# Patient Record
Sex: Female | Born: 1980 | Race: White | Hispanic: No | Marital: Married | Smoking: Never smoker
Health system: Southern US, Community
[De-identification: ages and names within clinical notes are randomized; demographics above are authoritative.]

## PROBLEM LIST (undated history)

## (undated) DIAGNOSIS — E119 Type 2 diabetes mellitus without complications: Secondary | ICD-10-CM

---
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
## 2000-09-20 ENCOUNTER — Other Ambulatory Visit: Admission: RE | Admit: 2000-09-20 | Discharge: 2000-09-20 | Payer: Self-pay | Admitting: *Deleted

## 2001-09-21 ENCOUNTER — Other Ambulatory Visit: Admission: RE | Admit: 2001-09-21 | Discharge: 2001-09-21 | Payer: Self-pay | Admitting: Obstetrics & Gynecology

## 2002-02-21 ENCOUNTER — Encounter: Admission: RE | Admit: 2002-02-21 | Discharge: 2002-05-22 | Payer: Self-pay | Admitting: Internal Medicine

## 2002-10-30 ENCOUNTER — Other Ambulatory Visit: Admission: RE | Admit: 2002-10-30 | Discharge: 2002-10-30 | Payer: Self-pay | Admitting: Obstetrics & Gynecology

## 2003-08-13 ENCOUNTER — Encounter: Admission: RE | Admit: 2003-08-13 | Discharge: 2003-08-13 | Payer: Self-pay | Admitting: Oncology

## 2003-08-20 ENCOUNTER — Ambulatory Visit (HOSPITAL_COMMUNITY): Admission: RE | Admit: 2003-08-20 | Discharge: 2003-08-20 | Payer: Self-pay | Admitting: Oncology

## 2004-08-20 ENCOUNTER — Encounter: Admission: RE | Admit: 2004-08-20 | Discharge: 2004-08-20 | Payer: Self-pay | Admitting: Internal Medicine

## 2005-11-12 ENCOUNTER — Inpatient Hospital Stay (HOSPITAL_COMMUNITY): Admission: AD | Admit: 2005-11-12 | Discharge: 2005-11-12 | Payer: Self-pay | Admitting: Obstetrics and Gynecology

## 2005-11-21 ENCOUNTER — Inpatient Hospital Stay (HOSPITAL_COMMUNITY): Admission: AD | Admit: 2005-11-21 | Discharge: 2005-11-29 | Payer: Self-pay | Admitting: Obstetrics and Gynecology

## 2005-11-22 ENCOUNTER — Encounter (INDEPENDENT_AMBULATORY_CARE_PROVIDER_SITE_OTHER): Payer: Self-pay | Admitting: *Deleted

## 2005-11-25 ENCOUNTER — Encounter (INDEPENDENT_AMBULATORY_CARE_PROVIDER_SITE_OTHER): Payer: Self-pay | Admitting: Cardiology

## 2008-06-25 ENCOUNTER — Ambulatory Visit: Payer: Self-pay | Admitting: Genetic Counselor

## 2008-07-12 ENCOUNTER — Encounter: Admission: RE | Admit: 2008-07-12 | Discharge: 2008-07-12 | Payer: Self-pay | Admitting: Obstetrics and Gynecology

## 2008-08-29 ENCOUNTER — Ambulatory Visit: Payer: Self-pay | Admitting: Genetic Counselor

## 2010-09-05 NOTE — Op Note (Signed)
Cathy Martin, Cathy Martin            ACCOUNT NO.:  [id number]   MEDICAL RECORD NO.:  [id number]          PATIENT TYPE:  INP   LOCATION:  9373                          [facility]ILITY[facility]  [facility]   PHYSICIAN:  Maxie Better, M.D.DATE OF BIRTH:  03-20-1981   DATE OF PROCEDURE:  DATE OF DISCHARGE:                                 OPERATIVE REPORT   PREOPERATIVE DIAGNOSIS:  1.  Arrest of dilatation.  2.  Insulin-dependent diabetes.  3.  Intrauterine gestation at 36+ weeks.  4.  Preeclampsia.   PROCEDURE:  1.  Primary cesarean section.  2.  Sharl Ma hysterotomy.   POSTOPERATIVE DIAGNOSIS:  1.  Arrest of dilatation.  2.  Insulin-dependent diabetes.  3.  Preeclampsia.  4.  Intrauterine gestation at 36+ weeks.  5.  Large-for-gestational-age baby.   ANESTHESIA:  Continuous spinal.   SURGEON:  Maxie Better, M.D.   ASSISTANT:  None.   PROCEDURE IN DETAIL:  Under adequate spinal anesthesia, the patient is  placed in the supine position with a left lateral tilt.  She was sterilely  prepped and draped in usual fashion.  An indwelling Foley catheter was  already in place.  The patient had a large pannus, which was not amenable to  slinging up the pannus superiorly.  Therefore, decision was made to do a  transverse incision above the pannus. 0.25% Marcaine was injected. Skin  incision was then made and carried down to the rectus fascia.  It  was about  3 to 4 inches deep.  When the fascia was reached it was opened transversely.  Fascia was then bluntly and sharply dissected off the rectus muscles in an  inferior fashion.  The rectus muscle was split in the midline. The  parietoperitoneum was opened bluntly and extended.  The vesicouterine  peritoneum was opened transversely.  The bladder was bluntly dissected off  the lower uterine segment and displaced inferiorly.  A curvilinear low  transverse uterine incision was then made and extended bilaterally using  bandage scissors.  Subsequent  delivery of a live female from the left occiput  posterior position was accomplished.  Baby had the cord looped against the  chest.  Baby was bulb suctioned in the abdomen.  Cord was clamped and cut.  The baby was transferred to the waiting pediatricians. who assigned Apgars  of 9 and 9 at one and five minutes.  Cord pH was obtained, which ultimately  was 7.10.  Cord bloods were obtained.  Cord blood private collection was  obtained for the patient.  The placenta was spontaneous, intact, and sent to  pathology.   Uterine cavity was then cleaned of debris.  Uterine incision had no  extension.  Uterine incision was closed in two layers, the first layer 0-  Monocryl running lock stitch, second layer was imbricated using 0-Monocryl  suture.  Right angle figure-of-eight suture was then placed, with good  hemostasis, for bleeding sites.  Normal tubes and ovaries were noted  bilaterally.   The abdomen was then copiously irrigated, suctioned.  Small bleeders were  cauterized.  Adequate hemostasis was noted.  The parietoperitoneum was now  closed, with  the rectus fascia was closed with 0-icryl x2.  The subcutaneous  layer was closed in two layers using 2-0 plain sutures and skin approximated  using Ethicon staples.   The weight of the baby was 7 pounds 15 ounces.  Sponge and instrument count  x2 was correct.  Intraoperative fluid was 1400 mL crystalloid.  Urine output  was 150 mL concentrated urine.  The estimated blood loss was 700 mL.   The patient tolerated procedure well, was transferred to recovery in stable  condition.      Maxie Better, M.D.  Electronically Signed     [place]/MEDQ  D:  11/22/2005  T:  11/23/2005  Job:  [id number]

## 2010-09-05 NOTE — Discharge Summary (Signed)
Cathy Martin, Cathy Martin            ACCOUNT NO.:  [id number]   MEDICAL RECORD NO.:  [id number]          PATIENT TYPE:  INP   LOCATION:  9371                          [facility]  [facility]   PHYSICIAN:  Maxie Better, M.D.DATE OF BIRTH:  1980-06-25   DATE OF ADMISSION:  11/21/2005  DATE OF DISCHARGE:  11/29/2005                                 DISCHARGE SUMMARY   ADMISSION DIAGNOSIS:  Active labor, insulin dependent diabetes mellitus, pre-  eclampsia, group B Strep positive, intrauterine gestation at 59 and 2/7  weeks.   DISCHARGE DIAGNOSIS:  Intrauterine gestation at 2 and 2/7 weeks delivered,  insulin dependent diabetes mellitus, hypertension, congestive heart failure,  possible diastolic dysfunction, arrest of dilatation.   PROCEDURE:  Primary cesarean section.   HISTORY OF PRESENT ILLNESS:  30 year old G1, P0, female at 29 and 2/7 weeks  with insulin dependent diabetes who was admitted with active labor.  The  patient was noted to be group B Strep culture positive.   HOSPITAL COURSE:  The patient was admitted to [facility].  Her urine  total protein collection had been 1264 mg for 24 hours.  She was classified  as a pre-eclamptic.  Her blood pressure on presentation was 193/91.  Magnesium sulfate was begun, penicillin prophylaxis was started, IV insulin  drip was begun, and epidural for pain management was initiated.  The patient  was monitored using the Glucomander for her blood sugar management.  The  patient spontaneously ruptured her membranes.  At that time, she was 4, -2,  right occiput anterior presentation, she had an intrauterine pressure  catheter placed, contractions were every 2-3 minutes, each contraction with  some variables.  Pitocin augmentation was done.  The patient began having  late decelerations.  The patient also had recalcitrant blood sugar levels in  the 250 range.  She progressed to 5 cm and no change was noted.  Recommendation made for a primary  cesarean section.  The patient had a  primary C-section resulting in delivery of a 7 pounds 15 ounce live female,  Apgars 9 and 9, cord pH 7.10.  Postoperatively, the patient was continued on  magnesium sulfate, insulin management per her endocrinologist.  She was in  the intensive care unit due to the magnesium sulfate and insulin drip.  Magnesium was continued.  Her insulin was adjusted.  On November 25, 2005, the  patient began having problems with breathing.  She had audible wheezing at  that time.  Her O2 saturation on room air was 84%.  She was given 20 mg  Lasix IV, Labetalol 20 mg IV was also given.  Blood pressure was 162/85.  Oxygen supplementation was done.  The patient was transferred back to the  AICU.  She had been started on Labetalol 100 mg b.i.d. for her blood  pressure.  Cardiology consultation was obtained for the chest x-ray that had  been performed as part of her evaluation that had revealed CHF with  bilateral pleural effusions and atelectasis.  Dr. Algie Coffer saw the patient.  He the chest x-ray showed mild worsening of pulmonary edema, small pleural  effusions.  The  patient was weighed.  Electrolytes were done.  Cardiac  markers were obtained and were noted to have the BNP slightly increased.  Repeat chest x-ray was done.  The patient began diuresing very well and her  pulmonary edema resolved.  She had an echo.  EKG showed normal sinus rhythm.  It was felt that the CHF was resolving.  Postop day six, the patient was  doing better, felt better.  Her O2 saturation on room air was 95-96%.  Her  blood pressure was 180/80 to 90.  She was felt to be stable to be  transferred to the postpartum floor.  She was continued on oral diuretics  and was felt well enough to be discharged.  On August 12, the patient had  good pain control, her blood pressure was in the 150s over 80s.  Her  incision was without evidence of infection.  She was discharged home.   DISPOSITION:  Home.    CONDITION ON DISCHARGE:  Stable.   DISCHARGE MEDICATIONS:  Lasix 40 mg one tablet twice a day for three days  then once daily, potassium chloride 20 mEq once twice a day for three days  then once a day, Altace 5 mg one p.o. daily, Labetalol 200 mg one p.o.  b.i.d., Avelox 400 mg one daily for four days, prenatal vitamins, Darvocet 1-  2 tablets every 4-6 hours as needed.   FOLLOW UP:  [facility] in four weeks, Dr. Algie Coffer in one week.   DISCHARGE INSTRUCTIONS:  The postpartum book given and review of PIH warning  signs.      Maxie Better, M.D.  Electronically Signed     [place]/MEDQ  D:  01/14/2006  T:  01/15/2006  Job:  [id number]

## 2010-09-05 NOTE — Consult Note (Signed)
Cathy Martin, Cathy Martin            ACCOUNT NO.:  [id number]   MEDICAL RECORD NO.:  [id number]          PATIENT TYPE:  MAT   LOCATION:  MATC                          [facility]  [facility]   PHYSICIAN:  Richardean Sale, M.D.   DATE OF BIRTH:  [birth year]   DATE OF CONSULTATION:  11/12/2005  DATE OF DISCHARGE:                                   CONSULTATION   CHIEF COMPLAINT:  Elevated blood pressure in the office.   HISTORY OF PRESENT ILLNESS:  This is a 30 year old white female gravida 1,  para 0 who is at [redacted] weeks gestation who went to the office today for a  routine visit, was found to have a blood pressure of 160/100.  No  proteinuria, and some increased swelling in the lower extremities.  The  patient was sent to the hospital for evaluation for possible preeclampsia.  Pregnancy has been complicated by insulin-dependent diabetes, preexisting,  and the patient is currently on an insulin pump.  Her insulin is managed by  Dr. Jacky Kindle.  She has had no trouble with hypertension in the past.  She  reports a mild headache that has been present intermittently throughout her  pregnancy.  She denies any visual changes or epigastric pain. She reports  good movement.  Denies any loss of fluids, vaginal bleeding or contractions.  Cervical exam in the office was closed.   PAST OBSTETRICS HISTORY:  Gravida 1, para 0.   PAST GYNECOLOGICAL HISTORY:  Remote history of an abnormal Pap smear.  No  LEEP or cone biopsy.  No history of HSV.   PAST MEDICAL HISTORY:  Significant for insulin-dependent diabetes, currently  on an insulin pump.   FAMILY HISTORY:  Positive for hypertension, diabetes, prostate cancer.  No  mental retardation, Down's syndrome, chromosome abnormalities, open neural  tube defects, cystic fibrosis.   SOCIAL HISTORY:  She is married.  Denies alcohol or drugs.  Works as a  Acupuncturist.  The patient was a former smoker and quite when she  found out she was pregnant.   MEDICATIONS:  1.  Humalog insulin pump.  2.  Prenatal vitamins.  3.  Benadryl p.r.n.   ALLERGIES:  MUSHROOMS.   PHYSICAL EXAMINATION:  VITAL SIGNS:  Afebrile.  Blood pressure range from  130-140/60-70.  GENERAL:  She is a well-developed, well-nourished white female who is in no  acute distress.  There is no periorbital edema.  HEART:  Regular rate and rhythm.  LUNGS:  Clear to auscultation bilaterally.  ABDOMEN:  Gravid, soft, obese.  Nontender.  Appears AGA.  EXTREMITIES:  Pitting edema 2+ to the mid shin bilaterally.  Legs are  nontender.  Deep tendon reflexes are 2+ bilaterally with no clonus.  NEUROLOGICAL:  Nonfocal.   STUDIES:  Fetal heart rate tracing is reactive.  Tocometry tracing shows  occasional contraction.  Ultrasound shows C. difficile at 3000 g which is  the 90-95 percentile for 35 weeks.  Amniotic fluid is normal at 11.3 cm and  position is vertex.   LABORATORY DATA:  Urine is negative for protein but is positive for white  blood cell count and  large amount of bacteria.  Hemoglobin 13.3, platelets  249,000.  Uric acid is 4.5.  LDH 143, AST 20, ALT 19, creatinine 0.7.   ASSESSMENT:  76.  A 30 year old gravida 1, para 0, white female who is at 35+ weeks      gestation with insulin-dependent diabetes now with elevated blood      pressures.  No evidence of proteinuria.  No symptoms or preeclampsia and      no laboratory studies suggesting of HELP syndrome.  2.  UTI.   PLAN:  1.  Will start Keflex 500 mg p.o. q.i.d. for the patient's UTI and will send      urine for culture.  2.  Will send patient home on modified bed rest with preeclampsia      precautions.  She is to perform a 24-hour urine collection and bring it      back to the hospital on Saturday morning.  3.  The patient is to return to the office on Monday for her scheduled OB      visit and an NFT.  She is to call for any headache, visual changes,      epigastric pain (headache that is any worse than her  typical daily      headache and not ridden by Tylenol).  4.  The patient is to call for any contractions, loss of fluid, vaginal      bleeding or decrease in fetal movement.  5.  The patient voices understanding of the above plan and agrees to      proceed.      Richardean Sale, M.D.  Electronically Signed     [place]/MEDQ  D:  11/12/2005  T:  11/12/2005  Job:  [id number]

## 2011-12-02 LAB — OB RESULTS CONSOLE GC/CHLAMYDIA
Chlamydia: NEGATIVE
Gonorrhea: NEGATIVE

## 2011-12-02 LAB — OB RESULTS CONSOLE RPR: RPR: NONREACTIVE

## 2012-02-01 LAB — OB RESULTS CONSOLE ABO/RH: RH Type: NEGATIVE

## 2012-04-20 NOTE — L&D Delivery Note (Signed)
Cesarean Section Procedure Note  Indications: previous uterine incision Kerr x 1, PROM, PreTerm Labor  Pre-operative Diagnosis: 35 week 4 day pregnancy.  Post-operative Diagnosis: same  Surgeon: Genia Del   Assistants: None  Anesthesia: Spinal anesthesia   Procedure Details   The patient was seen in the Holding Room. The risks, benefits, complications, treatment options, and expected outcomes were discussed with the patient.  The patient concurred with the proposed plan, giving informed consent.  The site of surgery properly noted/marked. The patient was taken to Operating Room # 9, identified as Cathy Martin and the procedure verified as C-Section Delivery. A Time Out was held and the above information confirmed.  After induction of anesthesia, the patient was draped and prepped in the usual sterile manner. A Pfannenstiel incision was made and carried down through the subcutaneous tissue to the fascia. Fascial incision was made and extended transversely. The fascia was separated from the underlying rectus tissue superiorly and inferiorly. The peritoneum was identified and entered.  Adhesions with the omentum were lysed.  Peritoneal incision was extended longitudinally. The utero-vesical peritoneal reflection was incised transversely and the bladder flap was bluntly freed from the lower uterine segment.  The Alexis retractor was inserted easily.  A low transverse uterine incision was made. Delivered from compound left arm, cephalic occiput post with neck in extension, low in pelvis presentation was a Female with Apgar scores pending from NICU. After the umbilical cord was clamped and cut cord blood was obtained for evaluation. The placenta was removed intact and appeared normal. The uterine outline, tubes and ovaries appeared normal. The uterine incision was closed with running locked sutures of Vicryl-0.  A deep left inferior extension was closed with a locked running suture of  Vicryl-0.  A second layer, a Lembert imbricated suture of Vicryl -0 was added. Hemostasis was observed. Lavage was carried out until clear. The fascia was then reapproximated with running sutures of Vicryl.  The adipose tissue was approximated with a running suture of Plain. The skin was reapproximated with Staples.  A compressive dressing was applied.  Instrument, sponge, and needle counts were correct prior the abdominal closure and at the conclusion of the case.   Estimated Blood Loss:  800 cc          Specimens: Placenta         Complications:  None; patient tolerated the procedure well.         Disposition: PACU - hemodynamically stable.         Condition: stable  Attending Attestation: I was present and scrubbed for the entire procedure.  Genia Del MD  06/03/2012 at 6:06 am

## 2012-06-03 ENCOUNTER — Encounter (HOSPITAL_COMMUNITY): Payer: Self-pay | Admitting: Anesthesiology

## 2012-06-03 ENCOUNTER — Encounter (HOSPITAL_COMMUNITY): Payer: Self-pay | Admitting: *Deleted

## 2012-06-03 ENCOUNTER — Encounter (HOSPITAL_COMMUNITY)
Admission: AD | Disposition: A | Discharge status: Home / Self Care | Payer: Self-pay | Source: Ambulatory Visit | Attending: Obstetrics and Gynecology

## 2012-06-03 ENCOUNTER — Inpatient Hospital Stay (HOSPITAL_COMMUNITY): Payer: Managed Care, Other (non HMO) | Admitting: Anesthesiology

## 2012-06-03 ENCOUNTER — Inpatient Hospital Stay (HOSPITAL_COMMUNITY)
Admission: AD | Admit: 2012-06-03 | Discharge: 2012-06-06 | DRG: 765 | Disposition: A | Discharge status: Home / Self Care | Payer: Managed Care, Other (non HMO) | Source: Ambulatory Visit | Attending: Obstetrics and Gynecology | Admitting: Obstetrics and Gynecology

## 2012-06-03 DIAGNOSIS — D649 Anemia, unspecified: Secondary | ICD-10-CM | POA: Diagnosis not present

## 2012-06-03 DIAGNOSIS — O34219 Maternal care for unspecified type scar from previous cesarean delivery: Principal | ICD-10-CM | POA: Diagnosis present

## 2012-06-03 DIAGNOSIS — O9903 Anemia complicating the puerperium: Secondary | ICD-10-CM | POA: Diagnosis not present

## 2012-06-03 DIAGNOSIS — E119 Type 2 diabetes mellitus without complications: Secondary | ICD-10-CM | POA: Diagnosis present

## 2012-06-03 DIAGNOSIS — O4290 Premature rupture of membranes, unspecified as to length of time between rupture and onset of labor, unspecified weeks of gestation: Secondary | ICD-10-CM | POA: Diagnosis present

## 2012-06-03 DIAGNOSIS — E109 Type 1 diabetes mellitus without complications: Secondary | ICD-10-CM | POA: Diagnosis present

## 2012-06-03 DIAGNOSIS — O2432 Unspecified pre-existing diabetes mellitus in childbirth: Secondary | ICD-10-CM | POA: Diagnosis present

## 2012-06-03 HISTORY — DX: Type 2 diabetes mellitus without complications: E11.9

## 2012-06-03 LAB — COMPREHENSIVE METABOLIC PANEL
ALT: 7 U/L (ref 0–35)
AST: 10 U/L (ref 0–37)
Albumin: 2.5 g/dL — ABNORMAL LOW (ref 3.5–5.2)
CO2: 15 mEq/L — ABNORMAL LOW (ref 19–32)
Chloride: 100 mEq/L (ref 96–112)
Creatinine, Ser: 0.62 mg/dL (ref 0.50–1.10)
GFR calc non Af Amer: >90 mL/min (ref >90)
Potassium: 4.1 mEq/L (ref 3.5–5.1)
Sodium: 132 mEq/L — ABNORMAL LOW (ref 135–145)
Total Bilirubin: 0.3 mg/dL (ref 0.3–1.2)

## 2012-06-03 LAB — CBC
HCT: 39 % (ref 36.0–46.0)
Hemoglobin: 13.2 g/dL (ref 12.0–15.0)
MCH: 29.5 pg (ref 26.0–34.0)
MCHC: 33.8 g/dL (ref 30.0–36.0)
MCV: 87.2 fL (ref 78.0–100.0)

## 2012-06-03 LAB — GLUCOSE, CAPILLARY

## 2012-06-03 LAB — URIC ACID: Uric Acid, Serum: 4.1 mg/dL (ref 2.4–7.0)

## 2012-06-03 SURGERY — Surgical Case
Anesthesia: Spinal | Site: Abdomen | Wound class: Clean Contaminated

## 2012-06-03 MED ORDER — MORPHINE SULFATE (PF) 0.5 MG/ML IJ SOLN
INTRAMUSCULAR | Status: DC | PRN
  Administered 2012-06-03: .2 mg via INTRATHECAL

## 2012-06-03 MED ORDER — MEPERIDINE HCL 25 MG/ML IJ SOLN: 6.2500 mg | INTRAMUSCULAR | Status: DC | PRN

## 2012-06-03 MED ORDER — SCOPOLAMINE 1 MG/3DAYS TD PT72
MEDICATED_PATCH | TRANSDERMAL | Status: AC
  Filled 2012-06-03: qty 1

## 2012-06-03 MED ORDER — SIMETHICONE 80 MG PO CHEW: 80.0000 mg | CHEWABLE_TABLET | ORAL | Status: DC | PRN

## 2012-06-03 MED ORDER — OXYCODONE-ACETAMINOPHEN 5-325 MG PO TABS
1.0000 | ORAL_TABLET | ORAL | Status: DC | PRN
  Administered 2012-06-03: 1 via ORAL
  Administered 2012-06-03: 2 via ORAL
  Administered 2012-06-04: 1 via ORAL
  Administered 2012-06-04: 2 via ORAL
  Administered 2012-06-04: 1 via ORAL
  Administered 2012-06-04: 2 via ORAL
  Administered 2012-06-04 – 2012-06-05 (×3): 1 via ORAL
  Administered 2012-06-05: 2 via ORAL
  Administered 2012-06-06: 1 via ORAL
  Filled 2012-06-03: qty 1
  Filled 2012-06-03: qty 2
  Filled 2012-06-03: qty 1
  Filled 2012-06-03: qty 2
  Filled 2012-06-03: qty 1
  Filled 2012-06-03: qty 2
  Filled 2012-06-03 (×3): qty 1
  Filled 2012-06-03: qty 2
  Filled 2012-06-03: qty 1

## 2012-06-03 MED ORDER — DIPHENHYDRAMINE HCL 50 MG/ML IJ SOLN: 25.0000 mg | INTRAMUSCULAR | Status: DC | PRN

## 2012-06-03 MED ORDER — LACTATED RINGERS IV SOLN
INTRAVENOUS | Status: DC
  Administered 2012-06-03: 05:00:00 via INTRAVENOUS

## 2012-06-03 MED ORDER — SENNOSIDES-DOCUSATE SODIUM 8.6-50 MG PO TABS
2.0000 | ORAL_TABLET | Freq: Every day | ORAL | Status: DC
  Administered 2012-06-03 – 2012-06-05 (×3): 2 via ORAL

## 2012-06-03 MED ORDER — MAGNESIUM HYDROXIDE 400 MG/5ML PO SUSP: 30.0000 mL | ORAL | Status: DC | PRN

## 2012-06-03 MED ORDER — KETOROLAC TROMETHAMINE 60 MG/2ML IM SOLN
INTRAMUSCULAR | Status: AC
  Filled 2012-06-03: qty 2

## 2012-06-03 MED ORDER — SCOPOLAMINE 1 MG/3DAYS TD PT72
1.0000 | MEDICATED_PATCH | Freq: Once | TRANSDERMAL | Status: AC
  Administered 2012-06-03: 1.5 mg via TRANSDERMAL

## 2012-06-03 MED ORDER — NALBUPHINE HCL 10 MG/ML IJ SOLN
5.0000 mg | INTRAMUSCULAR | Status: DC | PRN
  Filled 2012-06-03: qty 1

## 2012-06-03 MED ORDER — ONDANSETRON HCL 4 MG/2ML IJ SOLN: 4.0000 mg | Freq: Three times a day (TID) | INTRAMUSCULAR | Status: DC | PRN

## 2012-06-03 MED ORDER — LANOLIN HYDROUS EX OINT: 1.0000 "application " | TOPICAL_OINTMENT | CUTANEOUS | Status: DC | PRN

## 2012-06-03 MED ORDER — INSULIN PUMP
Freq: Three times a day (TID) | SUBCUTANEOUS | Status: DC
  Administered 2012-06-03: 17:00:00 via SUBCUTANEOUS
  Administered 2012-06-04: 4.55 via SUBCUTANEOUS
  Administered 2012-06-04: 14.5 via SUBCUTANEOUS
  Administered 2012-06-05: 9.85 via SUBCUTANEOUS
  Administered 2012-06-05: 10.1 via SUBCUTANEOUS
  Administered 2012-06-06: 13:00:00 via SUBCUTANEOUS
  Administered 2012-06-06: 1.8 via SUBCUTANEOUS
  Filled 2012-06-03: qty 1

## 2012-06-03 MED ORDER — INSULIN PUMP
Freq: Three times a day (TID) | SUBCUTANEOUS | Status: DC
  Filled 2012-06-03: qty 1

## 2012-06-03 MED ORDER — ONDANSETRON HCL 4 MG PO TABS: 4.0000 mg | ORAL_TABLET | ORAL | Status: DC | PRN

## 2012-06-03 MED ORDER — BUTORPHANOL TARTRATE 1 MG/ML IJ SOLN
1.0000 mg | Freq: Once | INTRAMUSCULAR | Status: AC
  Administered 2012-06-03: 1 mg via INTRAVENOUS
  Filled 2012-06-03: qty 1

## 2012-06-03 MED ORDER — KETOROLAC TROMETHAMINE 30 MG/ML IJ SOLN
30.0000 mg | Freq: Four times a day (QID) | INTRAMUSCULAR | Status: AC | PRN
  Filled 2012-06-03: qty 1

## 2012-06-03 MED ORDER — WITCH HAZEL-GLYCERIN EX PADS: 1.0000 "application " | MEDICATED_PAD | CUTANEOUS | Status: DC | PRN

## 2012-06-03 MED ORDER — BUPIVACAINE IN DEXTROSE 0.75-8.25 % IT SOLN
INTRATHECAL | Status: DC | PRN
  Administered 2012-06-03: 12 mg via INTRATHECAL

## 2012-06-03 MED ORDER — KETOROLAC TROMETHAMINE 60 MG/2ML IM SOLN
60.0000 mg | Freq: Once | INTRAMUSCULAR | Status: AC | PRN
  Administered 2012-06-03: 60 mg via INTRAMUSCULAR

## 2012-06-03 MED ORDER — LACTATED RINGERS IV SOLN
INTRAVENOUS | Status: DC | PRN
  Administered 2012-06-03: 04:00:00 via INTRAVENOUS

## 2012-06-03 MED ORDER — DIPHENHYDRAMINE HCL 25 MG PO CAPS
25.0000 mg | ORAL_CAPSULE | ORAL | Status: DC | PRN
  Filled 2012-06-03: qty 1

## 2012-06-03 MED ORDER — HYDROMORPHONE HCL PF 1 MG/ML IJ SOLN
INTRAMUSCULAR | Status: AC
  Filled 2012-06-03: qty 1

## 2012-06-03 MED ORDER — SODIUM CHLORIDE 0.9 % IJ SOLN
3.0000 mL | INTRAMUSCULAR | Status: DC | PRN
  Administered 2012-06-03: 3 mL via INTRAVENOUS

## 2012-06-03 MED ORDER — KETOROLAC TROMETHAMINE 30 MG/ML IJ SOLN
30.0000 mg | Freq: Four times a day (QID) | INTRAMUSCULAR | Status: AC | PRN
  Administered 2012-06-03: 30 mg via INTRAVENOUS

## 2012-06-03 MED ORDER — LACTATED RINGERS IV SOLN
INTRAVENOUS | Status: DC
  Administered 2012-06-03: 08:00:00 via INTRAVENOUS

## 2012-06-03 MED ORDER — FAMOTIDINE IN NACL 20-0.9 MG/50ML-% IV SOLN
20.0000 mg | Freq: Once | INTRAVENOUS | Status: AC
  Administered 2012-06-03: 20 mg via INTRAVENOUS
  Filled 2012-06-03: qty 50

## 2012-06-03 MED ORDER — FERROUS SULFATE 325 (65 FE) MG PO TABS
325.0000 mg | ORAL_TABLET | Freq: Two times a day (BID) | ORAL | Status: DC
Start: 2012-06-03 — End: 2012-06-04
  Administered 2012-06-03 – 2012-06-04 (×2): 325 mg via ORAL
  Filled 2012-06-03 (×2): qty 1

## 2012-06-03 MED ORDER — NALOXONE HCL 0.4 MG/ML IJ SOLN: 0.4000 mg | INTRAMUSCULAR | Status: DC | PRN

## 2012-06-03 MED ORDER — TETANUS-DIPHTH-ACELL PERTUSSIS 5-2.5-18.5 LF-MCG/0.5 IM SUSP
0.5000 mL | Freq: Once | INTRAMUSCULAR | Status: DC
  Filled 2012-06-03: qty 0.5

## 2012-06-03 MED ORDER — HYDROMORPHONE HCL PF 1 MG/ML IJ SOLN: 0.2500 mg | INTRAMUSCULAR | Status: DC | PRN

## 2012-06-03 MED ORDER — IBUPROFEN 600 MG PO TABS
600.0000 mg | ORAL_TABLET | Freq: Four times a day (QID) | ORAL | Status: DC
  Administered 2012-06-03 – 2012-06-06 (×11): 600 mg via ORAL
  Filled 2012-06-03 (×11): qty 1

## 2012-06-03 MED ORDER — CITRIC ACID-SODIUM CITRATE 334-500 MG/5ML PO SOLN
30.0000 mL | Freq: Once | ORAL | Status: AC
  Administered 2012-06-03: 30 mL via ORAL
  Filled 2012-06-03: qty 15

## 2012-06-03 MED ORDER — BUPIVACAINE HCL (PF) 0.25 % IJ SOLN
INTRAMUSCULAR | Status: DC | PRN
  Administered 2012-06-03: 10 mL

## 2012-06-03 MED ORDER — ONDANSETRON HCL 4 MG/2ML IJ SOLN
INTRAMUSCULAR | Status: DC | PRN
  Administered 2012-06-03: 4 mg via INTRAVENOUS

## 2012-06-03 MED ORDER — OXYTOCIN 10 UNIT/ML IJ SOLN
40.0000 [IU] | INTRAVENOUS | Status: DC | PRN
  Administered 2012-06-03: 40 [IU] via INTRAVENOUS

## 2012-06-03 MED ORDER — CEFAZOLIN SODIUM-DEXTROSE 2-3 GM-% IV SOLR
2.0000 g | Freq: Once | INTRAVENOUS | Status: AC
  Administered 2012-06-03: 2 g via INTRAVENOUS
  Filled 2012-06-03: qty 50

## 2012-06-03 MED ORDER — DIPHENHYDRAMINE HCL 25 MG PO CAPS: 25.0000 mg | ORAL_CAPSULE | Freq: Four times a day (QID) | ORAL | Status: DC | PRN

## 2012-06-03 MED ORDER — PROMETHAZINE HCL 25 MG/ML IJ SOLN: 6.2500 mg | INTRAMUSCULAR | Status: DC | PRN

## 2012-06-03 MED ORDER — FENTANYL CITRATE 0.05 MG/ML IJ SOLN
INTRAMUSCULAR | Status: DC | PRN
  Administered 2012-06-03: 12.5 ug via INTRATHECAL

## 2012-06-03 MED ORDER — LABETALOL HCL 200 MG PO TABS
200.0000 mg | ORAL_TABLET | Freq: Two times a day (BID) | ORAL | Status: DC
  Filled 2012-06-03 (×5): qty 1

## 2012-06-03 MED ORDER — ONDANSETRON HCL 4 MG/2ML IJ SOLN: 4.0000 mg | INTRAMUSCULAR | Status: DC | PRN

## 2012-06-03 MED ORDER — PRENATAL MULTIVITAMIN CH
1.0000 | ORAL_TABLET | Freq: Every day | ORAL | Status: DC
  Administered 2012-06-04 – 2012-06-06 (×3): 1 via ORAL
  Filled 2012-06-03 (×3): qty 1

## 2012-06-03 MED ORDER — ZOLPIDEM TARTRATE 5 MG PO TABS: 5.0000 mg | ORAL_TABLET | Freq: Every evening | ORAL | Status: DC | PRN

## 2012-06-03 MED ORDER — NALOXONE HCL 1 MG/ML IJ SOLN
1.0000 ug/kg/h | INTRAVENOUS | Status: DC | PRN
  Filled 2012-06-03: qty 2

## 2012-06-03 MED ORDER — OXYTOCIN 40 UNITS IN LACTATED RINGERS INFUSION - SIMPLE MED: 62.5000 mL/h | INTRAVENOUS | Status: AC

## 2012-06-03 MED ORDER — DIPHENHYDRAMINE HCL 50 MG/ML IJ SOLN: 12.5000 mg | INTRAMUSCULAR | Status: DC | PRN

## 2012-06-03 MED ORDER — METOCLOPRAMIDE HCL 5 MG/ML IJ SOLN: 10.0000 mg | Freq: Three times a day (TID) | INTRAMUSCULAR | Status: DC | PRN

## 2012-06-03 MED ORDER — MENTHOL 3 MG MT LOZG: 1.0000 | LOZENGE | OROMUCOSAL | Status: DC | PRN

## 2012-06-03 MED ORDER — KETOROLAC TROMETHAMINE 30 MG/ML IJ SOLN: 15.0000 mg | Freq: Once | INTRAMUSCULAR | Status: DC | PRN

## 2012-06-03 MED ORDER — DIBUCAINE 1 % RE OINT: 1.0000 "application " | TOPICAL_OINTMENT | RECTAL | Status: DC | PRN

## 2012-06-03 MED ORDER — SIMETHICONE 80 MG PO CHEW
80.0000 mg | CHEWABLE_TABLET | Freq: Three times a day (TID) | ORAL | Status: DC
  Administered 2012-06-03 – 2012-06-06 (×11): 80 mg via ORAL

## 2012-06-03 MED ORDER — MEPERIDINE HCL 25 MG/ML IJ SOLN
INTRAMUSCULAR | Status: DC | PRN
  Administered 2012-06-03 (×2): 12.5 mg via INTRAVENOUS

## 2012-06-03 MED FILL — Insulin Aspart Inj 100 Unit/ML: SUBCUTANEOUS | Qty: 10 | Status: AC

## 2012-06-03 SURGICAL SUPPLY — 37 items
CLOTH BEACON ORANGE TIMEOUT ST (SAFETY) ×2 IMPLANT
CONTAINER PREFILL 10% NBF 15ML (MISCELLANEOUS) IMPLANT
DRAPE LG THREE QUARTER DISP (DRAPES) ×2 IMPLANT
DRSG OPSITE POSTOP 4X10 (GAUZE/BANDAGES/DRESSINGS) ×2 IMPLANT
DURAPREP 26ML APPLICATOR (WOUND CARE) ×2 IMPLANT
ELECT REM PT RETURN 9FT ADLT (ELECTROSURGICAL) ×2
ELECTRODE REM PT RTRN 9FT ADLT (ELECTROSURGICAL) ×1 IMPLANT
EXTRACTOR VACUUM M CUP 4 TUBE (SUCTIONS) IMPLANT
GLOVE BIO SURGEON STRL SZ 6.5 (GLOVE) ×4 IMPLANT
GLOVE BIOGEL PI IND STRL 7.0 (GLOVE) ×1 IMPLANT
GLOVE BIOGEL PI INDICATOR 7.0 (GLOVE) ×1
GOWN PREVENTION PLUS LG XLONG (DISPOSABLE) ×6 IMPLANT
HEMOSTAT SURGICEL 2X14 (HEMOSTASIS) ×1 IMPLANT
KIT ABG SYR 3ML LUER SLIP (SYRINGE) IMPLANT
NDL HYPO 25X5/8 SAFETYGLIDE (NEEDLE) ×1 IMPLANT
NEEDLE HYPO 22GX1.5 SAFETY (NEEDLE) ×2 IMPLANT
NEEDLE HYPO 25X5/8 SAFETYGLIDE (NEEDLE) ×2 IMPLANT
PACK C SECTION WH (CUSTOM PROCEDURE TRAY) ×2 IMPLANT
PAD ABD 7.5X8 STRL (GAUZE/BANDAGES/DRESSINGS) ×1 IMPLANT
PAD OB MATERNITY 4.3X12.25 (PERSONAL CARE ITEMS) ×2 IMPLANT
RTRCTR C-SECT PINK 25CM LRG (MISCELLANEOUS) ×1 IMPLANT
SLEEVE SCD COMPRESS KNEE MED (MISCELLANEOUS) ×1 IMPLANT
STAPLER VISISTAT 35W (STAPLE) ×1 IMPLANT
SUT PLAIN 0 NONE (SUTURE) IMPLANT
SUT PLAIN 2 0 XLH (SUTURE) ×1 IMPLANT
SUT VIC AB 0 CT1 27 (SUTURE) ×4
SUT VIC AB 0 CT1 27XBRD ANBCTR (SUTURE) ×2 IMPLANT
SUT VIC AB 0 CTX 36 (SUTURE) ×8
SUT VIC AB 0 CTX36XBRD ANBCTRL (SUTURE) ×2 IMPLANT
SUT VIC AB 2-0 CT1 27 (SUTURE) ×2
SUT VIC AB 2-0 CT1 TAPERPNT 27 (SUTURE) ×1 IMPLANT
SUT VIC AB 3-0 SH 27 (SUTURE)
SUT VIC AB 3-0 SH 27X BRD (SUTURE) IMPLANT
SYR CONTROL 10ML LL (SYRINGE) ×2 IMPLANT
TOWEL OR 17X24 6PK STRL BLUE (TOWEL DISPOSABLE) ×6 IMPLANT
TRAY FOLEY CATH 14FR (SET/KITS/TRAYS/PACK) ×2 IMPLANT
WATER STERILE IRR 1000ML POUR (IV SOLUTION) ×2 IMPLANT

## 2012-06-03 NOTE — Anesthesia Procedure Notes (Signed)
Spinal  Patient location during procedure: OR Start time: 06/03/2012 4:12 AM End time: 06/03/2012 4:20 AM Staffing Anesthesiologist: Sandrea Hughs Performed by: anesthesiologist  Preanesthetic Checklist Completed: patient identified, site marked, surgical consent, pre-op evaluation, timeout performed, IV checked, risks and benefits discussed and monitors and equipment checked Spinal Block Patient position: sitting Prep: DuraPrep Patient monitoring: heart rate, cardiac monitor, continuous pulse ox and blood pressure Approach: midline Location: L3-4 Injection technique: single-shot Needle Needle type: Sprotte  Needle gauge: 24 G Needle length: 12.7 cm Needle insertion depth: 10 cm Assessment Sensory level: T6 Additional Notes Needed 17G Tuohy x 3 to 9cm followed by Sprotte to + CSF.

## 2012-06-03 NOTE — Progress Notes (Signed)
06-03-12 Have spoken with patient by phone to make sure patient is comfortable using her pump.  She states she is checking her own glucose, "keeping up with it myself" is what she stated.  Explained to her that the nursing staff would be checking the glucose values themselves for documentation purposes and to give the basis for any changes needed in basal rates.  Her MD is Dr. Jacky Kindle, with whom she left a message on the after-hour line. Told her that if she started having low glucose, to put her basal rates in 50 % reduction until she hears back from Dr. Jacky Kindle.  Pt is very familiar with her pump and has been using it under Dr. Jacky Kindle for several years.Pt awaiting to hear from Dr. Jacky Kindle as to how to change her rates and her bolus coverage for po intake and correction. Please call if I can be of assistance.

## 2012-06-03 NOTE — MAU Note (Signed)
Pt reports LOF @ 0030, then UC's q 2 min since

## 2012-06-03 NOTE — Anesthesia Preprocedure Evaluation (Signed)
Anesthesia Evaluation  Patient identified by MRN, date of birth, ID band Patient awake    Reviewed: Allergy & Precautions, H&P , NPO status , Patient's Chart, lab work & pertinent test results  Airway Mallampati: II TM Distance: >3 FB Neck ROM: full    Dental no notable dental hx.    Pulmonary neg pulmonary ROS,    Pulmonary exam normal       Cardiovascular negative cardio ROS      Neuro/Psych negative neurological ROS  negative psych ROS   GI/Hepatic negative GI ROS, Neg liver ROS,   Endo/Other  Morbid obesity  Renal/GU negative Renal ROS  negative genitourinary   Musculoskeletal negative musculoskeletal ROS (+)   Abdominal Normal abdominal exam  (+)   Peds negative pediatric ROS (+)  Hematology negative hematology ROS (+)   Anesthesia Other Findings   Reproductive/Obstetrics (+) Pregnancy                           Anesthesia Physical Anesthesia Plan  ASA: III and emergent  Anesthesia Plan: Spinal   Post-op Pain Management:    Induction:   Airway Management Planned:   Additional Equipment:   Intra-op Plan:   Post-operative Plan:   Informed Consent: I have reviewed the patients History and Physical, chart, labs and discussed the procedure including the risks, benefits and alternatives for the proposed anesthesia with the patient or authorized representative who has indicated his/her understanding and acceptance.     Plan Discussed with: CRNA and Surgeon  Anesthesia Plan Comments:         Anesthesia Quick Evaluation

## 2012-06-03 NOTE — Anesthesia Postprocedure Evaluation (Signed)
Anesthesia Post Note  Patient: Cathy Martin  Procedure(s) Performed: Procedure(s) (LRB): CESAREAN SECTION (N/A)  Anesthesia type: Spinal  Patient location: PACU  Post pain: Pain level controlled  Post assessment: Post-op Vital signs reviewed  Last Vitals:  Filed Vitals:   06/03/12 0700  BP: 164/78  Pulse: 78  Temp:   Resp: 22    Post vital signs: Reviewed  Level of consciousness: awake  Complications: No apparent anesthesia complications

## 2012-06-03 NOTE — Plan of Care (Signed)
Problem: Phase I Progression Outcomes Goal: Pain controlled with appropriate interventions Outcome: Completed/Met Date Met:  06/03/12 Good pain control with po Percocet and ATC Motrin.Patient encouraged to not go longer than 6 hour without taking pain medication to prevent severe pain.

## 2012-06-03 NOTE — H&P (Signed)
Subjective:  Cathy Martin is a 32 y.o. G2 P1 female with [facility] 07/04/12 per [place] at 29 and 4/[redacted] weeks gestation who is being admitted for C-section.  Her current obstetrical history is significant for prior c-section, Type I DM.  Patient reports AF leak and painful regular UCs.   Fetal Movement: normal.    HPP:  Last HbA1C at 6.9 per patient.  BPs wnl without meds.  Last [place] EFW 80%.  PSxH:  C/S, teeth extraction  ZOX:WRUE 1 DM          PEC with CHF/PE post C/S in G1.  Objective:   Vital signs in last 24 hours: Temp:  [98.1 F (36.7 C)] 98.1 F (36.7 C) (02/14 0307) Pulse Rate:  [97] 97 (02/14 0307) Resp:  [22] 22 (02/14 0307) BP: (133)/(80) 133/80 mmHg (02/14 0307) Weight:  [118.117 kg (260 lb 6.4 oz)] 118.117 kg (260 lb 6.4 oz) (02/14 0307)   General:   alert  Skin:   normal  HEENT:  wnl  Lungs:   clear to auscultation bilaterally  Heart:   regular rate and rhythm  Breasts:   Deferred  Abdomen:  Gravid  Pelvis:  Exam deferred.  FHT:  150 BPM  Uterine Size: size greater than dates  Presentations: cephalic  Cervix:    Dilation: 3cm   Effacement: 50%   Station:  -3   Consistency: soft   Position: middle   Lab Review  O, Rh -  AFP:NML, Ultrascreen neg [place] anato wnl Rhophylac received at 28 wks     Assessment/Plan:  35 and 4/[redacted] weeks gestation. PROM, Active labor.  FHR monitoring reassuring with mild variable decelerations. Obstetrical history significant for Type 1 DM.   H/O PEC and PO C/S CHF and PE.   Risks, benefits, alternatives and possible complications have been discussed in detail with the patient.  Pre-admission, admission, and post admission procedures and expectations were discussed in detail.  All questions answered, all appropriate consents will be signed at the Hospital. Admission is planned for today.  NPO.  Previous C/S for repeat C/S PROM in active labor.  Informed consent signed for Urgent repeat C/S.

## 2012-06-03 NOTE — Progress Notes (Signed)
Cathy Martin was in good spirits when I visited with her.  She is grateful that her son is doing well and that her own health is good, especially since she had some complications after her older son's birth.  She has experienced the NICU before with her 32 year-old and is coping well with the situation.  She has good family support.  I offered affirmation and reflective listening.  [employer] Pager, [phone] 1:42 PM   06/03/12 1300  Clinical Encounter Type  Visited With Patient and family together  Visit Type Spiritual support;Initial  Stress Factors  Patient Stress Factors (Baby in NICU)

## 2012-06-03 NOTE — Transfer of Care (Signed)
2Immediate Anesthesia Transfer of Care Note  Patient: Cathy Martin  Procedure(s) Performed: Procedure(s): CESAREAN SECTION (N/A)  Patient Location: PACU  Anesthesia Type:Spinal  Level of Consciousness: awake, alert , oriented and patient cooperative  Airway & Oxygen Therapy: Patient Spontanous Breathing  Post-op Assessment: Report given to PACU RN and Post -op Vital signs reviewed and stable  Post vital signs: Reviewed and stable  Complications: No apparent anesthesia complications

## 2012-06-03 NOTE — Anesthesia Postprocedure Evaluation (Signed)
  Anesthesia Post-op Note  Patient: Cathy Martin  Procedure(s) Performed: Procedure(s): CESAREAN SECTION (N/A)  Patient Location: Women's Unit  Anesthesia Type:Spinal  Level of Consciousness: awake  Airway and Oxygen Therapy: Patient Spontanous Breathing  Post-op Pain: mild  Post-op Assessment: Patient's Cardiovascular Status Stable and Respiratory Function Stable  Post-op Vital Signs: stable  Complications: No apparent anesthesia complications

## 2012-06-04 ENCOUNTER — Encounter (HOSPITAL_COMMUNITY): Payer: Self-pay | Admitting: *Deleted

## 2012-06-04 DIAGNOSIS — E119 Type 2 diabetes mellitus without complications: Secondary | ICD-10-CM | POA: Diagnosis present

## 2012-06-04 LAB — GLUCOSE, CAPILLARY
Glucose-Capillary: 211 mg/dL — ABNORMAL HIGH (ref 70–99)
Glucose-Capillary: 119 mg/dL — ABNORMAL HIGH (ref 70–99)

## 2012-06-04 LAB — CBC
HCT: 29.6 % — ABNORMAL LOW (ref 36.0–46.0)
Hemoglobin: 9.8 g/dL — ABNORMAL LOW (ref 12.0–15.0)
MCH: 29.3 pg (ref 26.0–34.0)
MCHC: 33.1 g/dL (ref 30.0–36.0)
MCV: 88.6 fL (ref 78.0–100.0)
RDW: 13.5 % (ref 11.5–15.5)

## 2012-06-04 MED ORDER — RHO D IMMUNE GLOBULIN 1500 UNIT/2ML IJ SOLN
300.0000 ug | Freq: Once | INTRAMUSCULAR | Status: AC
  Administered 2012-06-04: 300 ug via INTRAMUSCULAR
  Filled 2012-06-04: qty 2

## 2012-06-04 MED ORDER — POLYSACCHARIDE IRON COMPLEX 150 MG PO CAPS
150.0000 mg | ORAL_CAPSULE | Freq: Every day | ORAL | Status: DC
  Administered 2012-06-05 – 2012-06-06 (×2): 150 mg via ORAL
  Filled 2012-06-04 (×3): qty 1

## 2012-06-04 NOTE — Plan of Care (Signed)
Problem: Phase I Progression Outcomes Goal: OOB as tolerated unless otherwise ordered Outcome: Progressing Did walk to elevator and bathroom to do peri care and tolerated well. Goal: IS, TCDB as ordered Outcome: Completed/Met Date Met:  06/04/12 Can get I/S up to 2500 Goal: Initial discharge plan identified Outcome: Completed/Met Date Met:  06/04/12 Pain controlled VSS Understands self care Knows signs and symptoms of infection Understands f/u care Goal: Other Phase I Outcomes/Goals Outcome: Not Met (add Reason) Needs to void when foley is removed this AM  Problem: Phase II Progression Outcomes Goal: Pain controlled on oral analgesia Outcome: Completed/Met Date Met:  06/04/12 Good pain control on po Percocet and Motrin Goal: Progress activity as tolerated unless otherwise ordered Outcome: Completed/Met Date Met:  06/04/12 Has been up in the hallway and tolerated well

## 2012-06-04 NOTE — Progress Notes (Signed)
Called by pt regarding her DM. She just delivered yesterday and will manage her own pump. She will check AC/HS and 2 hours post meals and anytime she is off. We will back down basals and and leave boluses alone. She has fone up on Basals @ 33% (according to her) during the pregnancy Her current settings are Total 53.45 12 am 1.9 - changed to 1.8 3 am 2.3 - changed to 1.8 5:30 am 2.4 - changed to 2.0 9 am 2.5 -  changed to 2.1 5 pm 2.0 - No change 10 pm 1.8 - no change New total = 47.8  Current goal is to avoid lows and allow her to bolus often to maintain CBG 100-200.

## 2012-06-04 NOTE — Plan of Care (Signed)
Problem: Discharge Progression Outcomes Goal: Barriers To Progression Addressed/Resolved Outcome: Completed/Met Date Met:  06/04/12 Patient is voiding without any difficulty

## 2012-06-04 NOTE — Progress Notes (Addendum)
Post operative D1 s/p RLTCS at [redacted]w[redacted]d for PROM. 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  Subjective: no complaints, up ad lib without syncope, voiding, tolerating PO,  Pain well controlled with po meds,  BF: pumping for baby in NICU. Bbay feeding well. Mood stable, bonding well.  Objective: Blood pressure 129/74, pulse 90, temperature 98.5 F (36.9 C), temperature source Oral, resp. rate 18, height 5\' 6"  (1.676 m), weight 260 lb 6.4 oz (118.117 kg), SpO2 97.00%, unknown if currently breastfeeding.  Physical Exam:  Affect: AAO x 3 General: NAD  Lungs: CTAB Breasts: N/T CV: RRR Abdomen: Soft/ N/T. Incision :Dry and Intact and Occlusive dressing in place. Lochia: appropriate, min Uterine Fundus: firm, -2/u Perineum:intact DVT Evaluation: No evidence of DVT seen on physical exam. Negative Homan's sign. No cords or calf tenderness. No significant calf/ankle edema. Some slight swelling feet bilaterally   Recent Labs  06/03/12 0325 06/04/12 0500  HGB 13.2 9.8*  HCT 39.0 29.6*  Anemia of Pregnancy - delivered  Results for orders placed during the hospital encounter of 06/03/12 (from the past 24 hour(s))  GLUCOSE, CAPILLARY     Status: Abnormal   Collection Time    06/03/12 11:57 AM      Result Value Range   Glucose-Capillary 171 (*) 70 - 99 mg/dL  GLUCOSE, CAPILLARY     Status: Abnormal   Collection Time    06/03/12  2:04 PM      Result Value Range   Glucose-Capillary 208 (*) 70 - 99 mg/dL  GLUCOSE, CAPILLARY     Status: Abnormal   Collection Time    06/03/12  4:47 PM      Result Value Range   Glucose-Capillary 151 (*) 70 - 99 mg/dL  CBC     Status: Abnormal   Collection Time    06/04/12  5:00 AM      Result Value Range   WBC 12.2 (*) 4.0 - 10.5 K/uL   RBC 3.34 (*) 3.87 - 5.11 MIL/uL   Hemoglobin 9.8 (*) 12.0 - 15.0 g/dL   HCT 40.9 (*) 81.1 - 91.4 %   MCV 88.6  78.0 - 100.0 fL   MCH 29.3  26.0 - 34.0 pg   MCHC 33.1  30.0 - 36.0 g/dL   RDW 78.2   95.6 - 21.3 %   Platelets 184  150 - 400 K/uL  RH IG WORKUP (INCLUDES ABO/RH)     Status: None   Collection Time    06/04/12  5:00 AM      Result Value Range   Gestational Age(Wks) 36     ABO/RH(D) O NEG     Fetal Screen NEG     Unit Number [id number]     Blood Component Type RHIG     Unit division 00     Status of Unit ALLOCATED     Transfusion Status OK TO TRANSFUSE    GLUCOSE, CAPILLARY     Status: Abnormal   Collection Time    06/04/12  7:34 AM      Result Value Range   Glucose-Capillary 211 (*) 70 - 99 mg/dL  GLUCOSE, CAPILLARY     Status: Abnormal   Collection Time    06/04/12  9:48 AM      Result Value Range   Glucose-Capillary 181 (*) 70 - 99 mg/dL   Patient has had elevated Blood Glucose readings in past 24 hrs. Patient is using an insulin pump and is trying to contact her on call  endocrinologist to see if she needs to change her basal regime on her pump to cope with elevated Blood glucose readings. Will contact patient later today to see if she has been successful in communicating with her [facility] Team and see if any changes needed. If no success in contacting her Endo team, will need to get assistance from diabetic team at [facility] for  insulin Pump management.  Assessment/Plan: Breastfeeding and Lactation consult Circumcision at hospital prior to discharge To commenced Niferex 150 mgs po daily F/U for Pump management in pm To check baby RH status for Rhogam for mother.  Cathy Martin 06/04/2012, 10:55 AM

## 2012-06-05 LAB — RH IG WORKUP (INCLUDES ABO/RH): Unit division: 0

## 2012-06-05 LAB — GLUCOSE, CAPILLARY
Glucose-Capillary: 64 mg/dL — ABNORMAL LOW (ref 70–99)
Glucose-Capillary: 129 mg/dL — ABNORMAL HIGH (ref 70–99)
Glucose-Capillary: 92 mg/dL (ref 70–99)

## 2012-06-05 NOTE — Clinical Social Work Note (Signed)
Clinical Social Work Department PSYCHOSOCIAL ASSESSMENT - MATERNAL/CHILD 06/05/2012  Patient:  Cathy Martin,Cathy Martin  Account Number:  [id number]  Admit Date:  06/03/2012  Childs Name:   Jack Ayoub    Clinical Social Worker:  Ibrohim Simmers, LCSW   Date/Time:  06/04/2012 02:00 PM  Date Referred:  06/03/2012   Referral source  Physician  RN     Referred reason  NICU   Other referral source:    I:  FAMILY / HOME ENVIRONMENT Child's legal guardian:  PARENT  Guardian - Name Guardian - Age Guardian - Address  Cathy Martin 32 [address] [place] [address]  Cathy Martin  [address] [place] [address]   Other household support members/support persons Name Relationship DOB  David SON 6 years old   Other support:   MOB reports good family support in area.    II  PSYCHOSOCIAL DATA Information Source:  Patient Interview  Financial and Community Resources Employment:   MOB: [place]  FOB: ABISPL   Financial resources:  Private Insurance If Medicaid - County:    School / Grade:   Maternity Care Coordinator / Child Services Coordination / Early Interventions:  Cultural issues impacting care:    III  STRENGTHS Strengths  Supportive family/friends  Home prepared for Child (including basic supplies)  Adequate Resources  Compliance with medical plan  Understanding of illness   Strength comment:    IV  RISK FACTORS AND CURRENT PROBLEMS Current Problem:  None   Risk Factor & Current Problem Patient Issue Family Issue Risk Factor / Current Problem Comment   Martin Martin     V  SOCIAL WORK ASSESSMENT CSW spoke with MOB at bedside.  CSW discussed admission to NICU and communication/understanding of treatment.  MOB reports no concerns and having good communication with doctor's and nurses.  CSW discussed any emotional concerns and MOB expressed some appropriate emotions with admission to NICU, however no concerning symptoms at this time.  CSW discussed PPD symptoms  and instructed MOB to let RN or CSW know if any concerns arise.  MOB reports her 32 year old was in the NICU, so she has an understanding of NICU stay and emotions that can arise.  CSW discussed supplies and family support.  MOB did not express any concerns with supplies at this time.  MOB also reported good family support fom spouse and family in the area.  No significant social concerns for MOB or family.  CSW will continue to follow to offer support while infant in NICU.      VI SOCIAL WORK PLAN Social Work Plan  Psychosocial Support/Ongoing Assessment of Needs   Type of pt/family education:   If child protective services report - county:   If child protective services report - date:   Information/referral to community resources comment:   Other social work plan:    

## 2012-06-05 NOTE — Progress Notes (Signed)
Post Partum Day 2 Subjective: no complaints, up ad lib, voiding, tolerating PO and + flatus, pain control problems improved.  No PIH symptoms (prior hx of Preclampsia and pulm edema on ppd 2), no CP/SOB.   Objective: Blood pressure 122/81, pulse 71, temperature 97.3 F (36.3 C), temperature source Oral, resp. rate 18, height 5\' 6"  (1.676 m), weight 260 lb 6.4 oz (118.117 kg), SpO2 98.00%, unknown if currently breastfeeding.  Physical Exam:  General: alert and cooperative Lochia: appropriate Uterine Fundus: firm Incision: healing well DVT Evaluation: No evidence of DVT seen on physical exam.   Recent Labs  06/03/12 0325 06/04/12 0500  HGB 13.2 9.8*  HCT 39.0 29.6*    Assessment/Plan: PPD #2, post repeat c/s. Baby in NICU, stable. Plan for discharge tomorrow, Breastfeeding and Circumcision prior to discharge, awaiting NICU clearance. Continue to pump q 3 hrs for 15 minutes. No depression, good support. No Preclampsia this time IDDM, well controlled now since changing pump numbers with Dr Timothy Lasso.  Rh negative, s/p Rhogam.  Anemia- iron bid x 3 months with daily PNVIt.   LOS: 2 days   Rhegan Trunnell R 06/05/2012, 4:54 PM

## 2012-06-05 NOTE — Progress Notes (Addendum)
    Progress note not required

## 2012-06-05 NOTE — Progress Notes (Signed)
I Called pt today regarding her DM. She has been in the 60's this afternoon. She just delivered 06/03/12 by C Section and has been managing her own pump. She will check AC/HS and 2 hours post meals and anytime she is off. We will back down basals and and leave boluses alone. She has gone up on Basals @ 33% (according to her) during the pregnancy Her preg settings Totaled 53.45 - on 2/15 we lowered to 47.8  Preg             2/15                         2/16 12 am 1.9 - changed to 1.8 3 am 2.3 - changed to 1.8 5:30 am 2.4  2.0                    - change to 1.8 9 am 2.5 -    2.1                     - change to 1.8 5 pm 2.0 -                              - change to 1.8 10 pm 1.8 - no change  All Settings are now 1.8 and new total = 43.2 Do not hesitate to Suspend  Current goal is to avoid lows and allow her to bolus often to maintain CBG 100-200.

## 2012-06-06 ENCOUNTER — Encounter (HOSPITAL_COMMUNITY)
Admission: RE | Admit: 2012-06-06 | Discharge: 2012-06-06 | Disposition: A | Discharge status: Home / Self Care | Payer: Managed Care, Other (non HMO) | Source: Ambulatory Visit | Attending: Obstetrics and Gynecology | Admitting: Obstetrics and Gynecology

## 2012-06-06 ENCOUNTER — Encounter (HOSPITAL_COMMUNITY): Payer: Self-pay | Admitting: Obstetrics & Gynecology

## 2012-06-06 DIAGNOSIS — O923 Agalactia: Secondary | ICD-10-CM | POA: Insufficient documentation

## 2012-06-06 LAB — TYPE AND SCREEN: Unit division: 0

## 2012-06-06 LAB — GLUCOSE, CAPILLARY
Glucose-Capillary: 62 mg/dL — ABNORMAL LOW (ref 70–99)
Glucose-Capillary: 69 mg/dL — ABNORMAL LOW (ref 70–99)

## 2012-06-06 MED ORDER — POLYSACCHARIDE IRON COMPLEX 150 MG PO CAPS: 150.0000 mg | ORAL_CAPSULE | Freq: Every day | ORAL | Status: DC

## 2012-06-06 MED ORDER — IBUPROFEN 600 MG PO TABS: 600.0000 mg | ORAL_TABLET | Freq: Four times a day (QID) | ORAL | Status: DC

## 2012-06-06 MED ORDER — OXYCODONE-ACETAMINOPHEN 5-325 MG PO TABS: 1.0000 | ORAL_TABLET | ORAL | Status: DC | PRN

## 2012-06-06 NOTE — Progress Notes (Signed)
Ur chart review completed.  

## 2012-06-06 NOTE — Progress Notes (Signed)
Pt  D/c  Teaching complete   Ambulate    Out and visited to nicu

## 2012-06-06 NOTE — Discharge Summary (Signed)
POSTOPERATIVE DISCHARGE SUMMARY:  Patient ID: Cathy Martin MRN: [id number] DOB/AGE: 09/21/1980 32 y.o.  Admit date: 06/03/2012 Admission Diagnoses: IDDM / PROM / active labor   Discharge date:  06/06/2012 Discharge Diagnoses: POD 3 s/p cesarean section / IDDM / ABL anemia - postoperatively  Prenatal history: G2P0101   EDC : 07/04/2012, by Other Basis  Prenatal care at [facility]  Primary provider : Cousins Prenatal course complicated by IDDM / obesity  Prenatal Labs: ABO, Rh: O (10/14 0000) positive Antibody: POS (02/14 0325) Rubella: Immune (08/14 0000)  RPR: NON REACTIVE (02/14 0325)  HBsAg: Negative (08/14 0000)  HIV: Non-reactive (08/14 0000)   Medical / Surgical History :  Past medical history:  Past Medical History  Diagnosis Date  . Diabetes mellitus without complication     Past surgical history:  Past Surgical History  Procedure Laterality Date  . Cesarean section    . Cesarean section N/A 06/03/2012    Procedure: CESAREAN SECTION;  Surgeon: Cathy Del, MD;  Location: [facility];  Service: Obstetrics;  Laterality: N/A;    Family History: History reviewed. No pertinent family history.  Social History:  reports that she has never smoked. She does not have any smokeless tobacco history on file. She reports that she does not drink alcohol or use illicit drugs.  Allergies: Review of patient's allergies indicates no known allergies.   Current Medications at time of admission:  Prenatal vitamin Insulin pump  Procedures: Cesarean section delivery of newborn by Dr Cathy Martin  See operative report for further details APGAR (1 MIN): 8   APGAR (5 MINS): 9    Baby to NICU  Postoperative / postpartum course: adjustment of insulin pump setting by PCP provider  / stable status  Physical Exam:   VSS: Temp:  [97.7 F (36.5 C)-98 F (36.7 C)] 98 F (36.7 C) (02/17 0559) Pulse Rate:  [66-87] 87 (02/17 0559) Resp:  [15-16] 15 (02/17  0559) BP: (111-124)/(69-81) 123/69 mmHg (02/17 0559) SpO2:  [97 %-98 %] 97 % (02/17 0559)  LABS:  Recent Labs  06/04/12 0500  WBC 12.2*  HGB 9.8*  PLT 184    General: pleasant / ambulatory / NAD Heart:RRR Lungs: clear Abdomen:  Extremities: trace edema  Dressing: intact Incision:  approximated with staples / no erythema / no ecchymosis / no drainage  Discharge Instructions:  Discharged Condition: stable Activity: pelvic rest and postoperative restrictions x 2 weeks Diet: carb modified Medications: below   Medication List    TAKE these medications       calcium carbonate 500 MG chewable tablet  Commonly known as:  TUMS - dosed in mg elemental calcium  Chew 1-2 tablets by mouth 2 (two) times daily as needed for heartburn.     diphenhydrAMINE 25 MG tablet  Commonly known as:  BENADRYL  Take 25 mg by mouth daily as needed for allergies.     ibuprofen 600 MG tablet  Commonly known as:  ADVIL,MOTRIN  Take 1 tablet (600 mg total) by mouth every 6 (six) hours.     insulin pump 100 unit/ml Soln  Inject 1 each into the skin continuous. Pt uses Novolog insulin in pump.     iron polysaccharides 150 MG capsule  Commonly known as:  NIFEREX  Take 1 capsule (150 mg total) by mouth daily.     oxyCODONE-acetaminophen 5-325 MG per tablet  Commonly known as:  PERCOCET/ROXICET  Take 1-2 tablets by mouth every 4 (four) hours as needed.  Wound Care: WOB visit POD 7 for dressing and staple removal Postpartum Instructions: Cathy Martin discharge booklet - instructions reviewed Discharge to: Home  Follow up : [facility] in 6 weeks for routine postpartum visit                      Signed: Marlinda Martin CNM, MSN 06/06/2012, 12:55 PM

## 2012-06-06 NOTE — Progress Notes (Signed)
POSTOPERATIVE DAY # 3 S/P cesarean section / DM on insulin pump   S:         Reports feeling well             Tolerating po intake / no nausea / no vomiting / + flatus / + BM             Bleeding is light             Pain controlled with motrin and percocet             Up ad lib / ambulatory/ voiding QS  Newborn breast feeding & pumping             Adjusting insulin pump setting per PCP - running low & continue to adjust daily (BS less than 129)  O:  VS: BP 123/69  Pulse 87  Temp(Src) 98 F (36.7 C) (Oral)  Resp 15  Ht 5\' 6"  (1.676 m)  Wt 118.117 kg (260 lb 6.4 oz)  BMI 42.05 kg/m2  SpO2 97%   LABS:  Recent Labs  06/04/12 0500  WBC 12.2*  HGB 9.8*  PLT 184              Physical Exam:             Alert and Oriented X3  Lungs: Clear and unlabored  Heart: regular rate and rhythm / no mumurs  Abdomen: soft, non-tender, non-distended, active bowel sounds             Fundus: firm, non-tender, Ueven             Dressing intact              Incision:   No erythema / no ecchymosis / no drainage  Perineum: no edema  Lochia: light  Extremities: trace edema, no calf pain or tenderness, negative Homans  A:        POD # 3 S/P cesarean section            IDA - stable hemodynamically  P:        Routine postoperative care              Discharge to home     Marlinda Mike CNM, MSN 06/06/2012, 10:07 AM

## 2012-06-29 ENCOUNTER — Inpatient Hospital Stay (HOSPITAL_COMMUNITY)
Admission: RE | Admit: 2012-06-29 | Payer: Managed Care, Other (non HMO) | Source: Ambulatory Visit | Admitting: Obstetrics and Gynecology

## 2012-06-29 ENCOUNTER — Encounter (HOSPITAL_COMMUNITY): Admission: RE | Payer: Self-pay | Source: Ambulatory Visit

## 2012-06-29 SURGERY — Surgical Case
Anesthesia: Spinal

## 2013-06-04 ENCOUNTER — Emergency Department (HOSPITAL_BASED_OUTPATIENT_CLINIC_OR_DEPARTMENT_OTHER): Payer: Managed Care, Other (non HMO)

## 2013-06-04 ENCOUNTER — Inpatient Hospital Stay (HOSPITAL_BASED_OUTPATIENT_CLINIC_OR_DEPARTMENT_OTHER)
Admission: EM | Admit: 2013-06-04 | Discharge: 2013-06-09 | DRG: 637 | Disposition: A | Discharge status: Home / Self Care | Payer: Managed Care, Other (non HMO) | Attending: Internal Medicine | Admitting: Internal Medicine

## 2013-06-04 ENCOUNTER — Inpatient Hospital Stay (HOSPITAL_COMMUNITY): Payer: Managed Care, Other (non HMO)

## 2013-06-04 DIAGNOSIS — R778 Other specified abnormalities of plasma proteins: Secondary | ICD-10-CM

## 2013-06-04 DIAGNOSIS — D72829 Elevated white blood cell count, unspecified: Secondary | ICD-10-CM

## 2013-06-04 DIAGNOSIS — I214 Non-ST elevation (NSTEMI) myocardial infarction: Secondary | ICD-10-CM

## 2013-06-04 DIAGNOSIS — I4729 Other ventricular tachycardia: Secondary | ICD-10-CM | POA: Diagnosis not present

## 2013-06-04 DIAGNOSIS — A419 Sepsis, unspecified organism: Secondary | ICD-10-CM

## 2013-06-04 DIAGNOSIS — E1110 Type 2 diabetes mellitus with ketoacidosis without coma: Secondary | ICD-10-CM

## 2013-06-04 DIAGNOSIS — R7989 Other specified abnormal findings of blood chemistry: Secondary | ICD-10-CM

## 2013-06-04 DIAGNOSIS — J9600 Acute respiratory failure, unspecified whether with hypoxia or hypercapnia: Secondary | ICD-10-CM

## 2013-06-04 DIAGNOSIS — Z9641 Presence of insulin pump (external) (internal): Secondary | ICD-10-CM

## 2013-06-04 DIAGNOSIS — R4182 Altered mental status, unspecified: Secondary | ICD-10-CM | POA: Diagnosis present

## 2013-06-04 DIAGNOSIS — I498 Other specified cardiac arrhythmias: Secondary | ICD-10-CM | POA: Diagnosis present

## 2013-06-04 DIAGNOSIS — G9340 Encephalopathy, unspecified: Secondary | ICD-10-CM | POA: Diagnosis present

## 2013-06-04 DIAGNOSIS — I472 Ventricular tachycardia, unspecified: Secondary | ICD-10-CM | POA: Diagnosis not present

## 2013-06-04 DIAGNOSIS — E1010 Type 1 diabetes mellitus with ketoacidosis without coma: Principal | ICD-10-CM | POA: Diagnosis present

## 2013-06-04 DIAGNOSIS — Z794 Long term (current) use of insulin: Secondary | ICD-10-CM

## 2013-06-04 LAB — PROTIME-INR
INR: 1.48 (ref 0.00–1.49)
Prothrombin Time: 17.5 seconds — ABNORMAL HIGH (ref 11.6–15.2)

## 2013-06-04 LAB — POCT I-STAT 3, VENOUS BLOOD GAS (G3P V)
Acid-base deficit: 25 mmol/L — ABNORMAL HIGH (ref 0.0–2.0)
BICARBONATE: 5.9 meq/L — AB (ref 20.0–24.0)
O2 Saturation: 68 %
PH VEN: 6.949 — AB (ref 7.250–7.300)
PO2 VEN: 58 mmHg — AB (ref 30.0–45.0)
Patient temperature: 100.5
TCO2: 7 mmol/L (ref 0–100)
pCO2, Ven: 27.6 mmHg — ABNORMAL LOW (ref 45.0–50.0)

## 2013-06-04 LAB — CBC WITH DIFFERENTIAL/PLATELET
BAND NEUTROPHILS: 8 % (ref 0–10)
Basophils Absolute: 0 10*3/uL (ref 0.0–0.1)
Basophils Relative: 0 % (ref 0–1)
Eosinophils Absolute: 0 10*3/uL (ref 0.0–0.7)
Eosinophils Relative: 0 % (ref 0–5)
HEMATOCRIT: 48.9 % — AB (ref 36.0–46.0)
Hemoglobin: 15.6 g/dL — ABNORMAL HIGH (ref 12.0–15.0)
LYMPHS ABS: 3.4 10*3/uL (ref 0.7–4.0)
Lymphocytes Relative: 10 % — ABNORMAL LOW (ref 12–46)
MCH: 28.4 pg (ref 26.0–34.0)
MCHC: 31.9 g/dL (ref 30.0–36.0)
MCV: 88.9 fL (ref 78.0–100.0)
Monocytes Absolute: 0.7 10*3/uL (ref 0.1–1.0)
Monocytes Relative: 2 % — ABNORMAL LOW (ref 3–12)
Neutro Abs: 29.7 10*3/uL — ABNORMAL HIGH (ref 1.7–7.7)
Neutrophils Relative %: 80 % — ABNORMAL HIGH (ref 43–77)
PLATELETS: 558 10*3/uL — AB (ref 150–400)
RBC: 5.5 MIL/uL — ABNORMAL HIGH (ref 3.87–5.11)
RDW: 14.2 % (ref 11.5–15.5)
WBC: 33.8 10*3/uL — ABNORMAL HIGH (ref 4.0–10.5)

## 2013-06-04 LAB — RAPID URINE DRUG SCREEN, HOSP PERFORMED
AMPHETAMINES: NOT DETECTED
BARBITURATES: NOT DETECTED
BENZODIAZEPINES: NOT DETECTED
COCAINE: NOT DETECTED
Opiates: NOT DETECTED
Tetrahydrocannabinol: NOT DETECTED

## 2013-06-04 LAB — GLUCOSE, CAPILLARY
GLUCOSE-CAPILLARY: 424 mg/dL — AB (ref 70–99)
GLUCOSE-CAPILLARY: 366 mg/dL — AB (ref 70–99)
Glucose-Capillary: 416 mg/dL — ABNORMAL HIGH (ref 70–99)

## 2013-06-04 LAB — URINALYSIS, ROUTINE W REFLEX MICROSCOPIC
Bilirubin Urine: NEGATIVE
LEUKOCYTES UA: NEGATIVE
Nitrite: NEGATIVE
PH: 5.5 (ref 5.0–8.0)
Protein, ur: 30 mg/dL — AB
Specific Gravity, Urine: 1.028 (ref 1.005–1.030)
Urobilinogen, UA: 0.2 mg/dL (ref 0.0–1.0)

## 2013-06-04 LAB — HEPATIC FUNCTION PANEL
ALBUMIN: 4.3 g/dL (ref 3.5–5.2)
ALK PHOS: 204 U/L — AB (ref 39–117)
ALT: 13 U/L (ref 0–35)
AST: 15 U/L (ref 0–37)
Bilirubin, Direct: <0.2 mg/dL (ref 0.0–0.3)
TOTAL PROTEIN: 8.3 g/dL (ref 6.0–8.3)
Total Bilirubin: 0.2 mg/dL — ABNORMAL LOW (ref 0.3–1.2)

## 2013-06-04 LAB — COMPREHENSIVE METABOLIC PANEL
ALBUMIN: 3 g/dL — AB (ref 3.5–5.2)
ALT: 12 U/L (ref 0–35)
AST: 17 U/L (ref 0–37)
Alkaline Phosphatase: 139 U/L — ABNORMAL HIGH (ref 39–117)
BILIRUBIN TOTAL: 0.2 mg/dL — AB (ref 0.3–1.2)
BUN: 26 mg/dL — AB (ref 6–23)
CO2: <7 mEq/L — CL (ref 19–32)
CREATININE: 1.03 mg/dL (ref 0.50–1.10)
Calcium: 7.6 mg/dL — ABNORMAL LOW (ref 8.4–10.5)
Chloride: 112 mEq/L (ref 96–112)
GFR calc Af Amer: 82 mL/min — ABNORMAL LOW (ref >90)
GFR calc non Af Amer: 71 mL/min — ABNORMAL LOW (ref >90)
GLUCOSE: 252 mg/dL — AB (ref 70–99)
Potassium: 5 mEq/L (ref 3.7–5.3)
Sodium: 142 mEq/L (ref 137–147)
TOTAL PROTEIN: 6.1 g/dL (ref 6.0–8.3)

## 2013-06-04 LAB — BASIC METABOLIC PANEL
BUN: 28 mg/dL — AB (ref 6–23)
CALCIUM: 9.7 mg/dL (ref 8.4–10.5)
CHLORIDE: 97 meq/L (ref 96–112)
Creatinine, Ser: 0.8 mg/dL (ref 0.50–1.10)
GFR calc Af Amer: >90 mL/min (ref >90)
GFR calc non Af Amer: >90 mL/min (ref >90)
Glucose, Bld: 480 mg/dL — ABNORMAL HIGH (ref 70–99)
Potassium: 5.4 mEq/L — ABNORMAL HIGH (ref 3.7–5.3)
Sodium: 138 mEq/L (ref 137–147)

## 2013-06-04 LAB — PROCALCITONIN: PROCALCITONIN: 47.81 ng/mL

## 2013-06-04 LAB — PHOSPHORUS: PHOSPHORUS: 4.1 mg/dL (ref 2.3–4.6)

## 2013-06-04 LAB — URINE MICROSCOPIC-ADD ON

## 2013-06-04 LAB — AMYLASE: AMYLASE: 79 U/L (ref 0–105)

## 2013-06-04 LAB — PREGNANCY, URINE: Preg Test, Ur: NEGATIVE

## 2013-06-04 LAB — LACTIC ACID, PLASMA: LACTIC ACID, VENOUS: 2.2 mmol/L (ref 0.5–2.2)

## 2013-06-04 LAB — CG4 I-STAT (LACTIC ACID): LACTIC ACID, VENOUS: 5.72 mmol/L — AB (ref 0.5–2.2)

## 2013-06-04 LAB — LIPASE, BLOOD
Lipase: 10 U/L — ABNORMAL LOW (ref 11–59)
Lipase: 15 U/L (ref 11–59)

## 2013-06-04 LAB — MRSA PCR SCREENING: MRSA by PCR: NEGATIVE

## 2013-06-04 LAB — TROPONIN I: Troponin I: 0.71 ng/mL (ref <0.30)

## 2013-06-04 LAB — KETONES, QUALITATIVE

## 2013-06-04 LAB — MAGNESIUM: MAGNESIUM: 2.3 mg/dL (ref 1.5–2.5)

## 2013-06-04 MED ORDER — SODIUM CHLORIDE 0.9 % IV BOLUS (SEPSIS)
1000.0000 mL | Freq: Once | INTRAVENOUS | Status: AC
  Administered 2013-06-04: 1000 mL via INTRAVENOUS

## 2013-06-04 MED ORDER — ONDANSETRON HCL 4 MG/2ML IJ SOLN
4.0000 mg | Freq: Once | INTRAMUSCULAR | Status: AC
  Administered 2013-06-04: 4 mg via INTRAVENOUS
  Filled 2013-06-04: qty 2

## 2013-06-04 MED ORDER — LIDOCAINE HCL (CARDIAC) 20 MG/ML IV SOLN
INTRAVENOUS | Status: AC
  Filled 2013-06-04: qty 5

## 2013-06-04 MED ORDER — SODIUM CHLORIDE 0.9 % IV BOLUS (SEPSIS): 1000.0000 mL | Freq: Once | INTRAVENOUS | Status: DC

## 2013-06-04 MED ORDER — FENTANYL CITRATE 0.05 MG/ML IJ SOLN
INTRAMUSCULAR | Status: AC
  Administered 2013-06-04: 100 ug
  Filled 2013-06-04: qty 2

## 2013-06-04 MED ORDER — METOPROLOL TARTRATE 1 MG/ML IV SOLN
5.0000 mg | Freq: Once | INTRAVENOUS | Status: AC
Start: 2013-06-04 — End: 2013-06-04
  Administered 2013-06-04: 5 mg via INTRAVENOUS

## 2013-06-04 MED ORDER — DEXTROSE-NACL 5-0.45 % IV SOLN
INTRAVENOUS | Status: DC
  Administered 2013-06-04 – 2013-06-05 (×3): via INTRAVENOUS

## 2013-06-04 MED ORDER — MIDAZOLAM HCL 2 MG/2ML IJ SOLN
2.0000 mg | Freq: Once | INTRAMUSCULAR | Status: AC
  Administered 2013-06-04: 2 mg via INTRAVENOUS

## 2013-06-04 MED ORDER — INSULIN REGULAR HUMAN 100 UNIT/ML IJ SOLN
10.0000 [IU] | Freq: Once | INTRAMUSCULAR | Status: AC
  Administered 2013-06-04: 3.6 [IU] via INTRAVENOUS

## 2013-06-04 MED ORDER — SUCCINYLCHOLINE CHLORIDE 20 MG/ML IJ SOLN
INTRAMUSCULAR | Status: AC
  Filled 2013-06-04: qty 1

## 2013-06-04 MED ORDER — SODIUM CHLORIDE 0.9 % IV SOLN
1000.0000 mL | INTRAVENOUS | Status: DC
  Administered 2013-06-04: 1000 mL via INTRAVENOUS

## 2013-06-04 MED ORDER — MIDAZOLAM HCL 2 MG/2ML IJ SOLN
INTRAMUSCULAR | Status: AC
  Filled 2013-06-04: qty 4

## 2013-06-04 MED ORDER — HEPARIN SODIUM (PORCINE) 5000 UNIT/ML IJ SOLN
5000.0000 [IU] | Freq: Three times a day (TID) | INTRAMUSCULAR | Status: DC
  Administered 2013-06-04 – 2013-06-05 (×2): 5000 [IU] via SUBCUTANEOUS
  Filled 2013-06-04 (×5): qty 1

## 2013-06-04 MED ORDER — ADENOSINE 6 MG/2ML IV SOLN
6.0000 mg | Freq: Once | INTRAVENOUS | Status: AC
  Administered 2013-06-04: 6 mg via INTRAVENOUS
  Filled 2013-06-04 (×2): qty 2

## 2013-06-04 MED ORDER — DEXTROSE 50 % IV SOLN
25.0000 mL | INTRAVENOUS | Status: DC | PRN
Start: 2013-06-04 — End: 2013-06-06

## 2013-06-04 MED ORDER — SODIUM CHLORIDE 0.9 % IV SOLN: 1000.0000 mL | INTRAVENOUS | Status: DC

## 2013-06-04 MED ORDER — PIPERACILLIN-TAZOBACTAM 3.375 G IVPB 30 MIN
3.3750 g | Freq: Once | INTRAVENOUS | Status: AC
  Administered 2013-06-04: 3.375 g via INTRAVENOUS
  Filled 2013-06-04 (×2): qty 50

## 2013-06-04 MED ORDER — ETOMIDATE 2 MG/ML IV SOLN
INTRAVENOUS | Status: AC
  Administered 2013-06-04: 30 mg
  Filled 2013-06-04: qty 20

## 2013-06-04 MED ORDER — ROCURONIUM BROMIDE 50 MG/5ML IV SOLN
INTRAVENOUS | Status: AC
  Administered 2013-06-04: 100 mg
  Filled 2013-06-04: qty 2

## 2013-06-04 MED ORDER — MIDAZOLAM HCL 2 MG/2ML IJ SOLN
4.0000 mg | Freq: Once | INTRAMUSCULAR | Status: AC
  Administered 2013-06-04: 4 mg via INTRAVENOUS

## 2013-06-04 MED ORDER — INSULIN ASPART 100 UNIT/ML ~~LOC~~ SOLN: 1.0000 [IU] | SUBCUTANEOUS | Status: DC

## 2013-06-04 MED ORDER — SODIUM CHLORIDE 0.9 % IV SOLN: INTRAVENOUS | Status: DC

## 2013-06-04 MED ORDER — PROPOFOL 10 MG/ML IV EMUL: 5.0000 ug/kg/min | Freq: Once | INTRAVENOUS | Status: DC

## 2013-06-04 MED ORDER — SODIUM CHLORIDE 0.9 % IV SOLN: 1000.0000 mL | Freq: Once | INTRAVENOUS | Status: DC

## 2013-06-04 MED ORDER — INSULIN REGULAR HUMAN 100 UNIT/ML IJ SOLN
INTRAMUSCULAR | Status: AC
  Administered 2013-06-04: 3.6 [IU] via INTRAVENOUS
  Filled 2013-06-04: qty 1

## 2013-06-04 MED ORDER — DEXTROSE-NACL 5-0.45 % IV SOLN: INTRAVENOUS | Status: DC

## 2013-06-04 MED ORDER — SODIUM CHLORIDE 0.9 % IV SOLN
1000.0000 mL | Freq: Once | INTRAVENOUS | Status: AC
  Administered 2013-06-04: 1000 mL via INTRAVENOUS

## 2013-06-04 MED ORDER — PROPOFOL 10 MG/ML IV EMUL
INTRAVENOUS | Status: AC
  Filled 2013-06-04: qty 100

## 2013-06-04 MED ORDER — FENTANYL CITRATE 0.05 MG/ML IJ SOLN
100.0000 ug | INTRAMUSCULAR | Status: DC | PRN
  Administered 2013-06-04 – 2013-06-05 (×4): 100 ug via INTRAVENOUS
  Filled 2013-06-04 (×4): qty 2

## 2013-06-04 MED ORDER — DEXTROSE 5 % IV SOLN
2.0000 ug/min | INTRAVENOUS | Status: DC
  Administered 2013-06-04: 2 ug/min via INTRAVENOUS
  Filled 2013-06-04: qty 16

## 2013-06-04 MED ORDER — VANCOMYCIN HCL IN DEXTROSE 1-5 GM/200ML-% IV SOLN
1000.0000 mg | Freq: Once | INTRAVENOUS | Status: AC
Start: 2013-06-04 — End: 2013-06-04
  Administered 2013-06-04: 1000 mg via INTRAVENOUS
  Filled 2013-06-04: qty 200

## 2013-06-04 MED ORDER — SODIUM BICARBONATE 8.4 % IV SOLN
INTRAVENOUS | Status: DC
  Administered 2013-06-04 – 2013-06-05 (×2): via INTRAVENOUS
  Filled 2013-06-04 (×4): qty 150

## 2013-06-04 MED ORDER — ADENOSINE 6 MG/2ML IV SOLN
INTRAVENOUS | Status: AC
  Administered 2013-06-04: 12 mg
  Filled 2013-06-04: qty 2

## 2013-06-04 MED ORDER — DOPAMINE-DEXTROSE 3.2-5 MG/ML-% IV SOLN
2.0000 ug/kg/min | INTRAVENOUS | Status: DC
  Administered 2013-06-04: 5 ug/kg/min via INTRAVENOUS

## 2013-06-04 MED ORDER — FENTANYL CITRATE 0.05 MG/ML IJ SOLN: 100.0000 ug | INTRAMUSCULAR | Status: DC | PRN

## 2013-06-04 MED ORDER — PROPOFOL 10 MG/ML IV EMUL
5.0000 ug/kg/min | INTRAVENOUS | Status: DC
  Administered 2013-06-05 (×2): 50 ug/kg/min via INTRAVENOUS
  Filled 2013-06-04 (×2): qty 100

## 2013-06-04 MED ORDER — MIDAZOLAM HCL 2 MG/2ML IJ SOLN
INTRAMUSCULAR | Status: AC
  Filled 2013-06-04: qty 2

## 2013-06-04 MED ORDER — PROPOFOL 10 MG/ML IV BOLUS
100.0000 mg | Freq: Once | INTRAVENOUS | Status: AC
  Administered 2013-06-04: 100 mg via INTRAVENOUS
  Filled 2013-06-04: qty 20

## 2013-06-04 MED ORDER — SODIUM CHLORIDE 0.9 % IV SOLN: 250.0000 mL | INTRAVENOUS | Status: DC | PRN

## 2013-06-04 MED ORDER — SODIUM CHLORIDE 0.9 % IV SOLN
INTRAVENOUS | Status: AC
  Administered 2013-06-04: 17:00:00 via INTRAVENOUS
  Administered 2013-06-05: 12 [IU]/h via INTRAVENOUS
  Administered 2013-06-05: 05:00:00 via INTRAVENOUS
  Filled 2013-06-04 (×3): qty 1

## 2013-06-04 MED ORDER — DOPAMINE-DEXTROSE 3.2-5 MG/ML-% IV SOLN
INTRAVENOUS | Status: AC
  Filled 2013-06-04: qty 250

## 2013-06-04 NOTE — ED Provider Notes (Signed)
CSN: [id number]     Arrival date & time 06/04/13  1538 History   First MD Initiated Contact with Patient 06/04/13 1609     This chart was scribed for Shelda Jakes, MD by Arlan Organ, ED Scribe. This patient was seen in room MH05/MH05 and the patient's care was started 4:20 PM.   Chief Complaint  Patient presents with  . Emesis   Patient is a 33 y.o. female presenting with vomiting. The history is provided by a relative. No language interpreter was used.  Emesis Severity:  Severe Duration:  12 hours Timing:  Constant Number of daily episodes:  20 Progression:  Unchanged Chronicity:  New Relieved by:  Nothing Worsened by:  Nothing tried Ineffective treatments:  None tried Associated symptoms: no chills, no diarrhea and no fever    level V caveat applies due to altered mental status.  LEVEL 5 CAVEAT DUE TO CONDITION HPI Comments: Cathy Martin brought in by ambulance is a 33 y.o. 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 Husband reports about 20 episodes from time of onset. At this time she denies any diarrhea, fever, chills, cough, rash, or abdominal pain.  [facility]  Past Medical History  Diagnosis Date  . Diabetes mellitus without complication    Past Surgical History  Procedure Laterality Date  . Cesarean section    . Cesarean section N/A 06/03/2012    Procedure: CESAREAN SECTION;  Surgeon: Genia Del, MD;  Location: [facility];  Service: Obstetrics;  Laterality: N/A;   No family history on file. History  Substance Use Topics  . Smoking status: Never Smoker   . Smokeless tobacco: Not on file  . Alcohol Use: No   OB History   Grav Para Term Preterm Abortions TAB SAB Ect Mult Living   2 2  1      1      Review of Systems  Unable to perform ROS: Other  Constitutional: Negative for fever  and chills.  Respiratory: Negative for cough.   Gastrointestinal: Positive for vomiting. Negative for diarrhea.  Skin: Negative for rash.  Psychiatric/Behavioral: Positive for confusion.   level V caveat applies due to the altered mental status.    Allergies  Review of patient's allergies indicates no known allergies.  Home Medications   Current Outpatient Rx  Name  Route  Sig  Dispense  Refill  . Insulin Human (INSULIN PUMP) 100 unit/ml SOLN   Subcutaneous   Inject 1 each into the skin continuous. Pt uses Novolog insulin in pump.          Triage Vitals: SpO2 98%  LMP 05/18/2013  Physical Exam  Nursing note and vitals reviewed. Constitutional: She is oriented to person, place, and time. She appears well-developed and well-nourished.  HENT:  Head: Normocephalic and atraumatic.  Mouth/Throat: Oropharynx is clear and moist.  Eyes: EOM are normal. No scleral icterus.  Neck: Normal range of motion.  Cardiovascular: Normal rate.   Capillary refill 3 seconds Tachycardic  Pulmonary/Chest: Effort normal.  Abdominal: Soft. Bowel sounds are normal.  Musculoskeletal: Normal range of motion.  Neurological: She is alert and oriented to person, place, and time.  Pt was able to give her age  Skin: Skin is warm and dry.  Psychiatric: She has a normal mood and affect. Her behavior is normal.    ED Course  INTUBATION Date/Time: 06/04/2013 6:10 PM Performed by: Shelda Jakes. Authorized by: Shelda Jakes. Consent: The procedure was performed in an emergent situation. Patient identity confirmed: arm band Indications: airway protection Intubation method: lighted stylet Patient status: unconscious Preoxygenation: BVM Sedatives: etomidate Paralytic: rocuronium Laryngoscope size: Mac 4 Tube size: 7.5 mm Tube type: cuffed Number of attempts: 1 Cricoid pressure: no Cords visualized: yes Post-procedure assessment: chest rise and CO2 detector Breath sounds: equal Cuff  inflated: yes ETT to lip: 23 cm Tube secured with: ETT holder Patient tolerance: Patient tolerated the procedure well with no immediate complications.   (including critical care time)  DIAGNOSTIC STUDIES: Oxygen Saturation is 98% on RA, Normal by my interpretation.    COORDINATION OF CARE: 4:29 PM-Discussed treatment plan with pt at bedside and pt agreed to plan.     Labs Review Labs Reviewed  GLUCOSE, CAPILLARY - Abnormal; Notable for the following:    Glucose-Capillary 424 (*)    All other components within normal limits  BASIC METABOLIC PANEL - Abnormal; Notable for the following:    Potassium 5.4 (*)    CO2 <7 (*)    Glucose, Bld 480 (*)    BUN 28 (*)    All other components within normal limits  CBC WITH DIFFERENTIAL - Abnormal; Notable for the following:    WBC 33.8 (*)    RBC 5.50 (*)    Hemoglobin 15.6 (*)    HCT 48.9 (*)    Platelets 558 (*)    Neutrophils Relative % 80 (*)    Lymphocytes Relative 10 (*)    Monocytes Relative 2 (*)    Neutro Abs 29.7 (*)    All other components within normal limits  KETONES, QUALITATIVE - Abnormal; Notable for the following:    Acetone, Bld SMALL (*)    All other components within normal limits  URINALYSIS, ROUTINE W REFLEX MICROSCOPIC - Abnormal; Notable for the following:    Glucose, UA >1000 (*)    Hgb urine dipstick SMALL (*)    Ketones, ur >80 (*)    Protein, ur 30 (*)    All other components within normal limits  LIPASE, BLOOD - Abnormal; Notable for the following:    Lipase 10 (*)    All other components within normal limits  HEPATIC FUNCTION PANEL - Abnormal; Notable for the following:    Alkaline Phosphatase 204 (*)    Total Bilirubin 0.2 (*)    All other components within normal limits  GLUCOSE, CAPILLARY - Abnormal; Notable for the following:    Glucose-Capillary 416 (*)    All other components within normal limits  URINE MICROSCOPIC-ADD ON - Abnormal; Notable for the following:    Bacteria, UA FEW (*)     Casts GRANULAR CAST (*)    All other components within normal limits  POCT I-STAT 3, BLOOD GAS (G3P V) - Abnormal; Notable for the following:    pH, Ven 6.949 (*)    pCO2, Ven 27.6 (*)    pO2, Ven 58.0 (*)    Bicarbonate 5.9 (*)    Acid-base deficit 25.0 (*)    All other components within normal limits  CG4 I-STAT (LACTIC ACID) - Abnormal; Notable for the following:    Lactic Acid, Venous 5.72 (*)    All other components within normal limits  CULTURE, BLOOD (ROUTINE X 2)  CULTURE, BLOOD (ROUTINE X 2)  PREGNANCY, URINE  URINE RAPID DRUG SCREEN (HOSP PERFORMED)   Results for orders placed during the hospital encounter  of 06/04/13  GLUCOSE, CAPILLARY      Result Value Ref Range   Glucose-Capillary 424 (*) 70 - 99 mg/dL  BASIC METABOLIC PANEL      Result Value Ref Range   Sodium 138  137 - 147 mEq/L   Potassium 5.4 (*) 3.7 - 5.3 mEq/L   Chloride 97  96 - 112 mEq/L   CO2 <7 (*) 19 - 32 mEq/L   Glucose, Bld 480 (*) 70 - 99 mg/dL   BUN 28 (*) 6 - 23 mg/dL   Creatinine, Ser 1.610.80  0.50 - 1.10 mg/dL   Calcium 9.7  8.4 - 09.610.5 mg/dL   GFR calc non Af Amer >90  >90 mL/min   GFR calc Af Amer >90  >90 mL/min  CBC WITH DIFFERENTIAL      Result Value Ref Range   WBC 33.8 (*) 4.0 - 10.5 K/uL   RBC 5.50 (*) 3.87 - 5.11 MIL/uL   Hemoglobin 15.6 (*) 12.0 - 15.0 g/dL   HCT 04.548.9 (*) 40.936.0 - 81.146.0 %   MCV 88.9  78.0 - 100.0 fL   MCH 28.4  26.0 - 34.0 pg   MCHC 31.9  30.0 - 36.0 g/dL   RDW 91.414.2  78.211.5 - 95.615.5 %   Platelets 558 (*) 150 - 400 K/uL   Neutrophils Relative % 80 (*) 43 - 77 %   Lymphocytes Relative 10 (*) 12 - 46 %   Monocytes Relative 2 (*) 3 - 12 %   Eosinophils Relative 0  0 - 5 %   Basophils Relative 0  0 - 1 %   Band Neutrophils 8  0 - 10 %   Neutro Abs 29.7 (*) 1.7 - 7.7 K/uL   Lymphs Abs 3.4  0.7 - 4.0 K/uL   Monocytes Absolute 0.7  0.1 - 1.0 K/uL   Eosinophils Absolute 0.0  0.0 - 0.7 K/uL   Basophils Absolute 0.0  0.0 - 0.1 K/uL   WBC Morphology VACUOLATED NEUTROPHILS     KETONES, QUALITATIVE      Result Value Ref Range   Acetone, Bld SMALL (*) NEGATIVE  URINALYSIS, ROUTINE W REFLEX MICROSCOPIC      Result Value Ref Range   Color, Urine YELLOW  YELLOW   APPearance CLEAR  CLEAR   Specific Gravity, Urine 1.028  1.005 - 1.030   pH 5.5  5.0 - 8.0   Glucose, UA >1000 (*) NEGATIVE mg/dL   Hgb urine dipstick SMALL (*) NEGATIVE   Bilirubin Urine NEGATIVE  NEGATIVE   Ketones, ur >80 (*) NEGATIVE mg/dL   Protein, ur 30 (*) NEGATIVE mg/dL   Urobilinogen, UA 0.2  0.0 - 1.0 mg/dL   Nitrite NEGATIVE  NEGATIVE   Leukocytes, UA NEGATIVE  NEGATIVE  PREGNANCY, URINE      Result Value Ref Range   Preg Test, Ur NEGATIVE  NEGATIVE  URINE RAPID DRUG SCREEN (HOSP PERFORMED)      Result Value Ref Range   Opiates NONE DETECTED  NONE DETECTED   Cocaine NONE DETECTED  NONE DETECTED   Benzodiazepines NONE DETECTED  NONE DETECTED   Amphetamines NONE DETECTED  NONE DETECTED   Tetrahydrocannabinol NONE DETECTED  NONE DETECTED   Barbiturates NONE DETECTED  NONE DETECTED  LIPASE, BLOOD      Result Value Ref Range   Lipase 10 (*) 11 - 59 U/L  HEPATIC FUNCTION PANEL      Result Value Ref Range   Total Protein 8.3  6.0 - 8.3 g/dL  Albumin 4.3  3.5 - 5.2 g/dL   AST 15  0 - 37 U/L   ALT 13  0 - 35 U/L   Alkaline Phosphatase 204 (*) 39 - 117 U/L   Total Bilirubin 0.2 (*) 0.3 - 1.2 mg/dL   Bilirubin, Direct <1.6  0.0 - 0.3 mg/dL   Indirect Bilirubin NOT CALCULATED  0.3 - 0.9 mg/dL  GLUCOSE, CAPILLARY      Result Value Ref Range   Glucose-Capillary 416 (*) 70 - 99 mg/dL  URINE MICROSCOPIC-ADD ON      Result Value Ref Range   Squamous Epithelial / LPF RARE  RARE   RBC / HPF 0-2  <3 RBC/hpf   Bacteria, UA FEW (*) RARE   Casts GRANULAR CAST (*) NEGATIVE  POCT I-STAT 3, BLOOD GAS (G3P V)      Result Value Ref Range   pH, Ven 6.949 (*) 7.250 - 7.300   pCO2, Ven 27.6 (*) 45.0 - 50.0 mmHg   pO2, Ven 58.0 (*) 30.0 - 45.0 mmHg   Bicarbonate 5.9 (*) 20.0 - 24.0 mEq/L   TCO2 7   0 - 100 mmol/L   O2 Saturation 68.0     Acid-base deficit 25.0 (*) 0.0 - 2.0 mmol/L   Patient temperature 100.5 F     Collection site IV START     Drawn by Nurse     Sample type VENOUS     Comment NOTIFIED PHYSICIAN    CG4 I-STAT (LACTIC ACID)      Result Value Ref Range   Lactic Acid, Venous 5.72 (*) 0.5 - 2.2 mmol/L    Imaging Review Dg Chest Port 1 View  06/04/2013   CLINICAL DATA:  Emesis.  EXAM: PORTABLE CHEST - 1 VIEW  COMPARISON:  November 26, 2005.  FINDINGS: The heart size and mediastinal contours are within normal limits. Both lungs are clear. The visualized skeletal structures are unremarkable.  IMPRESSION: No active disease.   Electronically Signed   By: Roque Lias M.D.   On: 06/04/2013 17:12    EKG Interpretation    Date/Time:  Sunday June 04 2013 16:07:07 EST Ventricular Rate:  152 PR Interval:  124 QRS Duration: 76 QT Interval:  262 QTC Calculation: 416 R Axis:   94 Text Interpretation:  Sinus tachycardia Rightward axis Cannot rule out Anterior infarct , age undetermined Abnormal ECG Significant changes have occurred Confirmed by Nieves Barberi  MD, Tatem Fesler ([id number]) on 06/04/2013 4:15:07 PM               CRITICAL CARE Performed by: Shelda Jakes. Total critical care time: 60 Critical care time was exclusive of separately billable procedures and treating other patients. Critical care was necessary to treat or prevent imminent or life-threatening deterioration. Critical care was time spent personally by me on the following activities: development of treatment plan with patient and/or surrogate as well as nursing, discussions with consultants, evaluation of patient's response to treatment, examination of patient, obtaining history from patient or surrogate, ordering and performing treatments and interventions, ordering and review of laboratory studies, ordering and review of radiographic studies, pulse oximetry and re-evaluation of patient's condition.   MDM    Final diagnoses:  Sepsis  DKA (diabetic ketoacidoses)  Altered mental status    Patient is a type I diabetic since childhood has an insulin pump. Patient was doing well until this morning when she had sudden onset of a vomiting illness vomited more than 20 times. In route by EMS patient was markedly tachycardic  and started to have altered mental status. On arrival here she was sedated but would answer her age appropriately and follow commands. Initial impression was DKA. Blood gas was done early on which showed pH is 6.9 lm venous gas. PCO2 was not significantly elevated. Patient's blood sugars were in the 150s. Lactic acid was done early on to that was markedly elevated. The lactic acid was 5.7. Patient was given 2 L of IV fluids started on nonglucose stabilizer. No real improvement with the IV fluids heart rate remained 150-160 a sinus tach. Patient blood pressure was never hypotensive. Patient's mental status started to worsen now with more agitation and confusion. Start as not to follow commands well. Patient also had blood cultures done urine culture done and started on broad-spectrum antibiotics for wound was now presumed to be a septic picture along with the AA. Patient started on Zosyn and Vanco. Temp Foley being placed. Contacted pulmonary critical care informed them that we wouldn't intubate the patient due to the change in mental status and that the [facility] is going to need to transport to the ICU.  Intubation went smoothly no complications. Patient's blood pressure remained stable oxygen sats never fell below 95%. [facility] is in route. Patient started on propofol sedation drip. Patient also Propofol bolus.  I personally performed the services described in this documentation, which was scribed in my presence. The recorded information has been reviewed and is accurate.    Shelda Jakes, MD 06/04/13 [phone]

## 2013-06-04 NOTE — Progress Notes (Signed)
Resp Care Note;RT attempted to place arterial line,unsuccessful times 3 attempts.RN made aware..Marland Kitchen

## 2013-06-04 NOTE — ED Notes (Signed)
[facility]

## 2013-06-04 NOTE — H&P (Signed)
Name: Cathy Martin MRN: [id number] DOB: [birth year]    ADMISSION DATE:  06/04/2013  PRIMARY SERVICE: PCCM  CHIEF COMPLAINT:  Vomiting, DKA   BRIEF PATIENT DESCRIPTION:  33 years old female with PMH relevant for type 1 diabetes on insulin drip and adequate control at home. Presents with vomiting and hyperglycemia since 4 am. Found to be in severe DKA. No clear source of infection.   SIGNIFICANT EVENTS / STUDIES:  Chest X ray unremarkable.   LINES / TUBES: - Left IJ CVC - Left radial A. Line - Foley catheter - ETT  CULTURES: - Blood cultures pending - Urine cultures pending - RVP pending  ANTIBIOTICS: - Zosyn - Vancomycin  HISTORY OF PRESENT ILLNESS:   33 years old female with PMH relevant for type 1 diabetes on insulin drip and adequate control at home. Presents with nausea, vomiting and uncontrolled hyperglycemia since 4 am. Found to be in severe DKA. No clear source of infection. Initial VBG with pH of 6.94 bicarb <7. Intubated in the ED. At the time of my examination the patient is intubated, partially sedated, follows commands and denies any pain including abdominal on palpation. As per the husband, other than nausea and vomiting, she did not complain of chest pain, SOB, cough, sputum production, fever, diarrhea or urinary symptoms. There were no signs of pump malfunction.   PAST MEDICAL HISTORY :  Past Medical History  Diagnosis Date  . Diabetes mellitus without complication    Past Surgical History  Procedure Laterality Date  . Cesarean section    . Cesarean section N/A 06/03/2012    Procedure: CESAREAN SECTION;  Surgeon: Genia Del, MD;  Location: [facility];  Service: Obstetrics;  Laterality: N/A;   Prior to Admission medications   Medication Sig Start Date End Date Taking? Authorizing Provider  Insulin Human (INSULIN PUMP) 100 unit/ml SOLN Inject 1 each into the skin continuous. Pt uses Novolog insulin in pump.    Historical Provider, MD   No Known  Allergies  FAMILY HISTORY:  No family history on file. SOCIAL HISTORY:  reports that she has never smoked. She does not have any smokeless tobacco history on file. She reports that she does not drink alcohol or use illicit drugs.  REVIEW OF SYSTEMS:  Unable to provide.  SUBJECTIVE:   VITAL SIGNS: Temp:  [99.5 F (37.5 C)-100 F (37.8 C)] 100 F (37.8 C) (02/15 2300) Pulse Rate:  [128-168] 148 (02/15 2300) Resp:  [9-41] 29 (02/15 2300) BP: (77-176)/(32-113) 107/50 mmHg (02/15 2300) SpO2:  [91 %-100 %] 100 % (02/15 2300) FiO2 (%):  [40 %-100 %] 40 % (02/15 2302) Weight:  [226 lb (102.513 kg)] 226 lb (102.513 kg) (02/15 1849) HEMODYNAMICS:   VENTILATOR SETTINGS: Vent Mode:  [-] PRVC FiO2 (%):  [40 %-100 %] 40 % Set Rate:  [12 bmp] 12 bmp Vt Set:  [480 mL-650 mL] 480 mL PEEP:  [5 cmH20] 5 cmH20 Plateau Pressure:  [16 cmH20-17 cmH20] 16 cmH20 INTAKE / OUTPUT: Intake/Output     02/15 0701 - 02/16 0700   I.V. (mL/kg) 3135.2 (30.6)   IV Piggyback 200   Total Intake(mL/kg) 3335.2 (32.5)   Urine (mL/kg/hr) 970   Total Output 970   Net +2365.2         PHYSICAL EXAMINATION: General: Sedated, intubated, no acute distress. Eyes: Anicteric sclerae. Pupils are ENT: ETT in place. Trachea at midline.  Lymph: No cervical, supraclavicular, or axillary lymphadenopathy. Heart: Normal S1, S2. No murmurs, rubs, or gallops  appreciated. No bruits, equal pulses. Lungs: Normal excursion, no dullness to percussion. Good air movement bilaterally, without wheezes or crackles.  Abdomen: Abdomen soft, non-tender and not distended, normoactive bowel sounds. No hepatosplenomegaly or masses. Musculoskeletal: No clubbing or synovitis. No LE edema Skin: No rashes or lesions Neuro: Patient is sedated but follows commands.    LABS:  CBC  Recent Labs Lab 06/04/13 1550  WBC 33.8*  HGB 15.6*  HCT 48.9*  PLT 558*   Coag's  Recent Labs Lab 06/04/13 2125  INR 1.48   BMET  Recent  Labs Lab 06/04/13 1550 06/04/13 2125  NA 138 142  K 5.4* 5.0  CL 97 112  CO2 <7* <7*  BUN 28* 26*  CREATININE 0.80 1.03  GLUCOSE 480* 252*   Electrolytes  Recent Labs Lab 06/04/13 1550 06/04/13 2125  CALCIUM 9.7 7.6*  MG  --  2.3  PHOS  --  4.1   Sepsis Markers  Recent Labs Lab 06/04/13 1556 06/04/13 2125  LATICACIDVEN 5.72* 2.2  PROCALCITON  --  47.81   ABG No results found for this basename: PHART, PCO2ART, PO2ART,  in the last 168 hours Liver Enzymes  Recent Labs Lab 06/04/13 1550 06/04/13 2125  AST 15 17  ALT 13 12  ALKPHOS 204* 139*  BILITOT 0.2* 0.2*  ALBUMIN 4.3 3.0*   Cardiac Enzymes  Recent Labs Lab 06/04/13 2125  TROPONINI 0.71*   Glucose  Recent Labs Lab 06/04/13 1539 06/04/13 1656 06/04/13 1812  GLUCAP 424* 416* 366*    Imaging Dg Chest Port 1 View  06/04/2013   CLINICAL DATA:  Central line placement.  EXAM: PORTABLE CHEST - 1 VIEW  COMPARISON:  06/04/2013  FINDINGS: Left internal jugular central line is in place. The tip is at the cavoatrial junction. No pneumothorax.  Endotracheal tube is in stable position. Improving aeration of the lungs. No confluent opacities or effusions.  IMPRESSION: Left central line placement with the tip at the cavoatrial junction. No pneumothorax.  Improved aeration of the lungs.   Electronically Signed   By: Charlett NoseKevin  Dover M.D.   On: 06/04/2013 23:07   Dg Chest Portable 1 View  06/04/2013   CLINICAL DATA:  ET tube placement.  EXAM: PORTABLE CHEST - 1 VIEW  COMPARISON:  Chest 06/04/2013 at 1700 hr.  FINDINGS: Endotracheal tube is in place with the tip in good position at the level of the clavicular heads. Lungs are clear. Heart size is normal. No pneumothorax or pleural effusion.  IMPRESSION: ET tube in good position.  Lungs clear.   Electronically Signed   By: Drusilla Kannerhomas  Dalessio M.D.   On: 06/04/2013 18:27   Dg Chest Port 1 View  06/04/2013   CLINICAL DATA:  Emesis.  EXAM: PORTABLE CHEST - 1 VIEW  COMPARISON:   November 26, 2005.  FINDINGS: The heart size and mediastinal contours are within normal limits. Both lungs are clear. The visualized skeletal structures are unremarkable.  IMPRESSION: No active disease.   Electronically Signed   By: Roque LiasJames  Green M.D.   On: 06/04/2013 17:12     CXR:  - ETT in good position - Left IJ CVC in good position - No evidence of parenchymal lung disease.   ASSESSMENT / PLAN:  PULMONARY A: 1) Acute respiratory failure secondary to DKA P:   - Mechanical ventilation   - PRVC, Vt: 8cc/kg, PEEP: 5, RR: 32, FiO2: 100% and adjust to keep O2 sat > 94%   - VAP prevention order set   -  Daily awakening and SBT   CARDIOVASCULAR A:  1) Hypotension, likely secondary to DKA / volume depletion. Cannot rule out sepsis. 2) Mildly elevated troponin, likely demand ischemia, no evidence of STEMI on EKG P:  - Aggressive IVF resuscitation per DKA protocol  - Norepinephrine drip - Left radial A. Line in place - Will continue to follow serial troponin.   RENAL A:   1) Acute severe metabolic acidosis secondary to DKA 2) Lactic acidosis improving P:   - Continue IVF resuscitation per DKA protocol - Will start bicarb drip since last pH still 6.9  GASTROINTESTINAL A:   1) Nausea and vomiting, likely secondary to DKA, possibly viral infection.  P:   - NPO - Protonix IV  HEMATOLOGIC A:   1) No issues P:  - Will follow CBC  INFECTIOUS A:   1) No clear source of infection . Elevated WBC, sepsis? P:   - Blood, sputum and urine cultures - RVP - Will get rotavirus antigen  ENDOCRINE A:   1) Severe DKA P:   -DKA protocol -isotonic saline until euvolemic -insulin gtt per protocol,  transition to sq when anion gap is normal, bicarb is >18  -transition to D51/2 NS after blood glucose is <250, transition to NSL when able to take pos.  -close observation of K, replace as indicated.  -close observation of PO4 and replace as indicated  NEUROLOGIC A:   1) Intubated,  sedated. P:   - Continue propofol - Daily awakening  TODAY'S SUMMARY:   I have personally obtained a history, examined the patient, evaluated laboratory and imaging results, formulated the assessment and plan and placed orders. CRITICAL CARE: The patient is critically ill with multiple organ systems failure and requires high complexity decision making for assessment and support, frequent evaluation and titration of therapies, application of advanced monitoring technologies and extensive interpretation of multiple databases. Critical Care Time devoted to patient care services described in this note is 60 minutes.   Overton Mam, MD Pulmonary and Critical Care Medicine [facility] Pager: [phone]  06/04/2013, 11:15 PM

## 2013-06-04 NOTE — Procedures (Signed)
Central Venous Catheter Insertion Procedure Note Cathy Martin [id number] May 11, 1980  Procedure: Insertion of Central Venous Catheter Indications: Assessment of intravascular volume, Drug and/or fluid administration and Frequent blood sampling  Procedure Details Consent: Risks of procedure as well as the alternatives and risks of each were explained to the (patient/caregiver).  Consent for procedure obtained. Time Out: Verified patient identification, verified procedure, site/side was marked, verified correct patient position, special equipment/implants available, medications/allergies/relevent history reviewed, required imaging and test results available.  Performed  Maximum sterile technique was used including antiseptics, cap, gloves, gown, hand hygiene, mask and sheet. Skin prep: Chlorhexidine; local anesthetic administered A antimicrobial bonded/coated triple lumen catheter was placed in the left internal jugular vein using the Seldinger technique. Procedure done under direct visualization with ultrasound.  Evaluation Blood flow good Complications: No apparent complications Patient did tolerate procedure well. Chest X-ray ordered to verify placement.  CXR: normal.  Cathy Martin 06/04/2013, 11:34 PM

## 2013-06-04 NOTE — Progress Notes (Signed)
[facility] Physician-Brief Progress Note Patient Name: Cathy RetortSamantha N Sparlin DOB: 11/09/1980 MRN: [id number]  Date of Service  06/04/2013   HPI/Events of Note   Hypotension noted ~ 10 min following adenosine/metoprolol intervention.   eICU Interventions   IVF bolus started. Propafol Rx verbally ordered to stop (was stopped via orders ~ 1 hr ago). BP improving.     Intervention Category Major Interventions: Hypotension - evaluation and management  [name] R. 06/04/2013, 9:12 PM

## 2013-06-04 NOTE — Progress Notes (Signed)
eLink Physician-Brief Progress Note Patient Name: Cathy RetortSamantha N Martin DOB: 16-Mar-1981 MRN: [id number]  Date of Service  06/04/2013   HPI/Events of Note   Patient arrived. Holding orders placed. Bedside MD to see once available.   eICU Interventions   Holding orders placed.    Intervention Category Major Interventions: Acid-Base disturbance - evaluation and management  Chloe Miyoshi R. 06/04/2013, 7:53 PM

## 2013-06-04 NOTE — Progress Notes (Signed)
eLink Physician-Brief Progress Note Patient Name: Cathy RetortSamantha N Klontz DOB: 1980/08/08 MRN: [id number]  Date of Service  06/04/2013   HPI/Events of Note   Hypotension again noted.   eICU Interventions   Dopamine started.    Intervention Category Major Interventions: Hypotension - evaluation and management  Katelynn Heidler R. 06/04/2013, 9:21 PM

## 2013-06-04 NOTE — Progress Notes (Signed)
CRITICAL VALUE ALERT  Critical value received:  Troponin 0.71  Date of notification:  06/04/13  Time of notification:  1041  Critical value read back:yes  Nurse who received alert:  Ernie HewJohn Perrin, RN  MD notified (1st page):  [phone]  Responding MD:  Dr. Marry GuanSiqueiros

## 2013-06-04 NOTE — ED Notes (Addendum)
Per EMS, Pt having emesis since 4 am.  Pt has Type 1 diabetes on insulin pump.  Pt hyperventilating.

## 2013-06-04 NOTE — Procedures (Signed)
Arterial Catheter Insertion Procedure Note Cathy Martin [id number] 12-19-1980  Procedure: Insertion of Arterial Catheter  Indications: Blood pressure monitoring and Frequent blood sampling  Procedure Details Consent: Risks of procedure as well as the alternatives and risks of each were explained to the (patient/caregiver).  Consent for procedure obtained. Time Out: Verified patient identification, verified procedure, site/side was marked, verified correct patient position, special equipment/implants available, medications/allergies/relevent history reviewed, required imaging and test results available.  Performed  Maximum sterile technique was used including antiseptics, cap, gloves, gown, hand hygiene, mask and sheet. Skin prep: Chlorhexidine; local anesthetic administered 22 gauge catheter was inserted into left radial artery using the Seldinger technique. Procedure done under direct visualization with ultrasound.  Evaluation Blood flow good; BP tracing good. Complications: No apparent complications.   Overton MamSIQUEIROS, Mickal Meno 06/04/2013

## 2013-06-04 NOTE — Progress Notes (Signed)
HR in 160s on arrival. Per MD, pt attached to EKG and given 4 mg Versed and 100 mcg Fentanly. 6 mg Adenosine administered. No response in HR. Another 12 mg Adenosine administered with no response. One more dose of 12 mg Adenosine administered resulted in brief drop in HR to low 100s, but HR not sustained. MD requested 5 mg of Metoprolol be administered, following which, the pt's HR decreased to 130s sustained. Pt later started on Dopamine drip for hypotension resulting from drug administration. BP stable now. Will continue to monitor pt.

## 2013-06-04 NOTE — Progress Notes (Signed)
eLink Physician-Brief Progress Note Patient Name: Cathy RetortSamantha N Martin DOB: March 25, 1981 MRN: [id number]  Date of Service  06/04/2013   HPI/Events of Note   SVT on monitor confirmed.  eICU Interventions   Adenosine 6 mg x 1, 12 mg x 2. Unsuccessful. Stat recheck of electrolytes pending. May need adenosine.     Intervention Category Major Interventions: Arrhythmia - evaluation and management  Bodee Lafoe R. 06/04/2013, 8:10 PM

## 2013-06-04 NOTE — Progress Notes (Signed)
eLink Physician-Brief Progress Note Patient Name: Cathy RetortSamantha N Martin DOB: July 31, 1980 MRN: [id number]  Date of Service  06/04/2013   HPI/Events of Note   SVT persists.  eICU Interventions   Metoprolol 5mg  IV given with adequate rate control. (HR~130). Stat recheck of electrolytes.    Intervention Category Major Interventions: Arrhythmia - evaluation and management  Eloyce Bultman R. 06/04/2013, 8:39 PM

## 2013-06-04 NOTE — Progress Notes (Signed)
CRITICAL VALUE ALERT  Critical value received: CO2<7  Date of notification:  06/04/13  Time of notification:  2240  Critical value read back:yes  Nurse who received alert:  Jasper Rilingebecca M Zailyn Thoennes RN  MD notified (1st page):  Dr. Neale BurlySigueiros (told in person)  Time of first page:  [phone]  MD notified (2nd page):  Time of second page:  Responding MD:  Dr. Neale BurlySigueiros at beside  Time MD responded:  n/a

## 2013-06-05 ENCOUNTER — Encounter (HOSPITAL_COMMUNITY): Payer: Self-pay | Admitting: Anesthesiology

## 2013-06-05 DIAGNOSIS — E1110 Type 2 diabetes mellitus with ketoacidosis without coma: Secondary | ICD-10-CM

## 2013-06-05 DIAGNOSIS — R799 Abnormal finding of blood chemistry, unspecified: Secondary | ICD-10-CM

## 2013-06-05 DIAGNOSIS — R000 Tachycardia, unspecified: Secondary | ICD-10-CM

## 2013-06-05 DIAGNOSIS — J9600 Acute respiratory failure, unspecified whether with hypoxia or hypercapnia: Secondary | ICD-10-CM

## 2013-06-05 DIAGNOSIS — D72829 Elevated white blood cell count, unspecified: Secondary | ICD-10-CM

## 2013-06-05 LAB — RESPIRATORY VIRUS PANEL
Adenovirus: NOT DETECTED
INFLUENZA A H1: NOT DETECTED
INFLUENZA A: NOT DETECTED
Influenza A H3: NOT DETECTED
Influenza B: NOT DETECTED
METAPNEUMOVIRUS: NOT DETECTED
PARAINFLUENZA 3 A: NOT DETECTED
Parainfluenza 1: NOT DETECTED
Parainfluenza 2: NOT DETECTED
Respiratory Syncytial Virus A: NOT DETECTED
Respiratory Syncytial Virus B: NOT DETECTED
Rhinovirus: NOT DETECTED

## 2013-06-05 LAB — POCT I-STAT 3, ART BLOOD GAS (G3+)
Acid-base deficit: 25 mmol/L — ABNORMAL HIGH (ref 0.0–2.0)
Acid-base deficit: 20 mmol/L — ABNORMAL HIGH (ref 0.0–2.0)
Bicarbonate: 5 mEq/L — ABNORMAL LOW (ref 20.0–24.0)
Bicarbonate: 6.5 mEq/L — ABNORMAL LOW (ref 20.0–24.0)
O2 SAT: 100 %
O2 SAT: 100 %
PCO2 ART: 21 mmHg — AB (ref 35.0–45.0)
PCO2 ART: 18.4 mmHg — AB (ref 35.0–45.0)
PO2 ART: 551 mmHg — AB (ref 80.0–100.0)
PO2 ART: 224 mmHg — AB (ref 80.0–100.0)
Patient temperature: 100
Patient temperature: 100.5
TCO2: 6 mmol/L (ref 0–100)
TCO2: 7 mmol/L (ref 0–100)
pH, Arterial: 6.992 — CL (ref 7.350–7.450)
pH, Arterial: 7.163 — CL (ref 7.350–7.450)

## 2013-06-05 LAB — BASIC METABOLIC PANEL
BUN: 27 mg/dL — ABNORMAL HIGH (ref 6–23)
BUN: 28 mg/dL — ABNORMAL HIGH (ref 6–23)
BUN: 27 mg/dL — ABNORMAL HIGH (ref 6–23)
BUN: 26 mg/dL — AB (ref 6–23)
BUN: 23 mg/dL (ref 6–23)
BUN: 20 mg/dL (ref 6–23)
CALCIUM: 7.9 mg/dL — AB (ref 8.4–10.5)
CALCIUM: 8.2 mg/dL — AB (ref 8.4–10.5)
CHLORIDE: 112 meq/L (ref 96–112)
CHLORIDE: 113 meq/L — AB (ref 96–112)
CO2: <7 mEq/L — CL (ref 19–32)
CO2: 12 mEq/L — ABNORMAL LOW (ref 19–32)
CO2: 13 mEq/L — ABNORMAL LOW (ref 19–32)
CO2: 15 meq/L — AB (ref 19–32)
CO2: 15 meq/L — AB (ref 19–32)
CREATININE: 1.1 mg/dL (ref 0.50–1.10)
CREATININE: 1.08 mg/dL (ref 0.50–1.10)
Calcium: 8.1 mg/dL — ABNORMAL LOW (ref 8.4–10.5)
Calcium: 7.9 mg/dL — ABNORMAL LOW (ref 8.4–10.5)
Calcium: 8 mg/dL — ABNORMAL LOW (ref 8.4–10.5)
Calcium: 7.9 mg/dL — ABNORMAL LOW (ref 8.4–10.5)
Chloride: 110 mEq/L (ref 96–112)
Chloride: 112 mEq/L (ref 96–112)
Chloride: 112 mEq/L (ref 96–112)
Chloride: 114 mEq/L — ABNORMAL HIGH (ref 96–112)
Creatinine, Ser: 1.23 mg/dL — ABNORMAL HIGH (ref 0.50–1.10)
Creatinine, Ser: 1.29 mg/dL — ABNORMAL HIGH (ref 0.50–1.10)
Creatinine, Ser: 1.26 mg/dL — ABNORMAL HIGH (ref 0.50–1.10)
Creatinine, Ser: 1.21 mg/dL — ABNORMAL HIGH (ref 0.50–1.10)
GFR calc Af Amer: 66 mL/min — ABNORMAL LOW (ref >90)
GFR calc Af Amer: 62 mL/min — ABNORMAL LOW (ref >90)
GFR calc Af Amer: 64 mL/min — ABNORMAL LOW (ref >90)
GFR calc Af Amer: 67 mL/min — ABNORMAL LOW (ref >90)
GFR calc Af Amer: 77 mL/min — ABNORMAL LOW (ref >90)
GFR calc non Af Amer: 57 mL/min — ABNORMAL LOW (ref >90)
GFR calc non Af Amer: 54 mL/min — ABNORMAL LOW (ref >90)
GFR calc non Af Amer: 55 mL/min — ABNORMAL LOW (ref >90)
GFR calc non Af Amer: 65 mL/min — ABNORMAL LOW (ref >90)
GFR calc non Af Amer: 67 mL/min — ABNORMAL LOW (ref >90)
GFR, EST AFRICAN AMERICAN: 76 mL/min — AB (ref >90)
GFR, EST NON AFRICAN AMERICAN: 58 mL/min — AB (ref >90)
GLUCOSE: 195 mg/dL — AB (ref 70–99)
GLUCOSE: 166 mg/dL — AB (ref 70–99)
GLUCOSE: 127 mg/dL — AB (ref 70–99)
GLUCOSE: 126 mg/dL — AB (ref 70–99)
GLUCOSE: 124 mg/dL — AB (ref 70–99)
Glucose, Bld: 197 mg/dL — ABNORMAL HIGH (ref 70–99)
POTASSIUM: 3.7 meq/L (ref 3.7–5.3)
Potassium: 4.9 mEq/L (ref 3.7–5.3)
Potassium: 4.3 mEq/L (ref 3.7–5.3)
Potassium: 3.5 mEq/L — ABNORMAL LOW (ref 3.7–5.3)
Potassium: 3.5 mEq/L — ABNORMAL LOW (ref 3.7–5.3)
Potassium: 3.7 mEq/L (ref 3.7–5.3)
SODIUM: 140 meq/L (ref 137–147)
SODIUM: 141 meq/L (ref 137–147)
Sodium: 139 mEq/L (ref 137–147)
Sodium: 139 mEq/L (ref 137–147)
Sodium: 140 mEq/L (ref 137–147)
Sodium: 142 mEq/L (ref 137–147)

## 2013-06-05 LAB — BASIC METABOLIC PANEL WITH GFR
BUN: 27 mg/dL — ABNORMAL HIGH (ref 6–23)
CO2: <7 meq/L — CL (ref 19–32)
Calcium: 7.9 mg/dL — ABNORMAL LOW (ref 8.4–10.5)
Chloride: 112 meq/L (ref 96–112)
Creatinine, Ser: 1.11 mg/dL — ABNORMAL HIGH (ref 0.50–1.10)
GFR calc Af Amer: 75 mL/min — ABNORMAL LOW
GFR calc non Af Amer: 64 mL/min — ABNORMAL LOW
Glucose, Bld: 177 mg/dL — ABNORMAL HIGH (ref 70–99)
Potassium: 5.1 meq/L (ref 3.7–5.3)
Sodium: 141 meq/L (ref 137–147)

## 2013-06-05 LAB — URINALYSIS, ROUTINE W REFLEX MICROSCOPIC
Glucose, UA: >1000 mg/dL — AB
Hgb urine dipstick: NEGATIVE
KETONES UR: 40 mg/dL — AB
Leukocytes, UA: NEGATIVE
NITRITE: NEGATIVE
PH: 5.5 (ref 5.0–8.0)
Protein, ur: 30 mg/dL — AB
SPECIFIC GRAVITY, URINE: 1.03 (ref 1.005–1.030)
Urobilinogen, UA: 0.2 mg/dL (ref 0.0–1.0)

## 2013-06-05 LAB — TROPONIN I
Troponin I: 2.25 ng/mL (ref <0.30)
Troponin I: 2.7 ng/mL

## 2013-06-05 LAB — HEPARIN LEVEL (UNFRACTIONATED): HEPARIN UNFRACTIONATED: 0.62 [IU]/mL (ref 0.30–0.70)

## 2013-06-05 LAB — CBC
HEMATOCRIT: 38.4 % (ref 36.0–46.0)
HEMOGLOBIN: 12.4 g/dL (ref 12.0–15.0)
MCH: 28.9 pg (ref 26.0–34.0)
MCHC: 32.3 g/dL (ref 30.0–36.0)
MCV: 89.5 fL (ref 78.0–100.0)
Platelets: 377 10*3/uL (ref 150–400)
RBC: 4.29 MIL/uL (ref 3.87–5.11)
RDW: 14.2 % (ref 11.5–15.5)
WBC: 35.5 10*3/uL — AB (ref 4.0–10.5)

## 2013-06-05 LAB — GLUCOSE, CAPILLARY
GLUCOSE-CAPILLARY: 164 mg/dL — AB (ref 70–99)
GLUCOSE-CAPILLARY: 180 mg/dL — AB (ref 70–99)
GLUCOSE-CAPILLARY: 182 mg/dL — AB (ref 70–99)
GLUCOSE-CAPILLARY: 182 mg/dL — AB (ref 70–99)
GLUCOSE-CAPILLARY: 193 mg/dL — AB (ref 70–99)
GLUCOSE-CAPILLARY: 207 mg/dL — AB (ref 70–99)
GLUCOSE-CAPILLARY: 285 mg/dL — AB (ref 70–99)
Glucose-Capillary: 340 mg/dL — ABNORMAL HIGH (ref 70–99)
Glucose-Capillary: 241 mg/dL — ABNORMAL HIGH (ref 70–99)
Glucose-Capillary: 137 mg/dL — ABNORMAL HIGH (ref 70–99)
Glucose-Capillary: 197 mg/dL — ABNORMAL HIGH (ref 70–99)
Glucose-Capillary: 199 mg/dL — ABNORMAL HIGH (ref 70–99)
Glucose-Capillary: 195 mg/dL — ABNORMAL HIGH (ref 70–99)
Glucose-Capillary: 179 mg/dL — ABNORMAL HIGH (ref 70–99)
Glucose-Capillary: 136 mg/dL — ABNORMAL HIGH (ref 70–99)
Glucose-Capillary: 140 mg/dL — ABNORMAL HIGH (ref 70–99)
Glucose-Capillary: 103 mg/dL — ABNORMAL HIGH (ref 70–99)

## 2013-06-05 LAB — PROCALCITONIN: PROCALCITONIN: 59.02 ng/mL

## 2013-06-05 LAB — URINE MICROSCOPIC-ADD ON

## 2013-06-05 LAB — CORTISOL: Cortisol, Plasma: 26.8 ug/dL

## 2013-06-05 LAB — TSH: TSH: <0.008 u[IU]/mL — ABNORMAL LOW (ref 0.350–4.500)

## 2013-06-05 MED ORDER — LEVOFLOXACIN IN D5W 750 MG/150ML IV SOLN
750.0000 mg | INTRAVENOUS | Status: DC
  Administered 2013-06-05: 750 mg via INTRAVENOUS
  Filled 2013-06-05 (×2): qty 150

## 2013-06-05 MED ORDER — SODIUM CHLORIDE 0.9 % IJ SOLN: 10.0000 mL | INTRAMUSCULAR | Status: DC | PRN

## 2013-06-05 MED ORDER — POTASSIUM CHLORIDE 10 MEQ/50ML IV SOLN
10.0000 meq | INTRAVENOUS | Status: AC
  Administered 2013-06-05 (×2): 10 meq via INTRAVENOUS

## 2013-06-05 MED ORDER — ENOXAPARIN SODIUM 40 MG/0.4ML ~~LOC~~ SOLN
40.0000 mg | SUBCUTANEOUS | Status: DC
  Filled 2013-06-05: qty 0.4

## 2013-06-05 MED ORDER — HEPARIN BOLUS VIA INFUSION
4000.0000 [IU] | Freq: Once | INTRAVENOUS | Status: AC
  Administered 2013-06-05: 4000 [IU] via INTRAVENOUS
  Filled 2013-06-05: qty 4000

## 2013-06-05 MED ORDER — ASPIRIN 300 MG RE SUPP
300.0000 mg | Freq: Once | RECTAL | Status: AC
  Administered 2013-06-05: 300 mg via RECTAL
  Filled 2013-06-05: qty 1

## 2013-06-05 MED ORDER — ASPIRIN EC 325 MG PO TBEC
325.0000 mg | DELAYED_RELEASE_TABLET | Freq: Every day | ORAL | Status: DC
  Administered 2013-06-06 – 2013-06-08 (×3): 325 mg via ORAL
  Filled 2013-06-05 (×3): qty 1

## 2013-06-05 MED ORDER — INFLUENZA VAC SPLIT QUAD 0.5 ML IM SUSP: 0.5000 mL | INTRAMUSCULAR | Status: DC | PRN

## 2013-06-05 MED ORDER — KCL IN DEXTROSE-NACL 20-5-0.45 MEQ/L-%-% IV SOLN
INTRAVENOUS | Status: DC
  Administered 2013-06-05 – 2013-06-06 (×4): via INTRAVENOUS
  Filled 2013-06-05 (×7): qty 1000

## 2013-06-05 MED ORDER — PANTOPRAZOLE SODIUM 40 MG IV SOLR
40.0000 mg | INTRAVENOUS | Status: DC
  Administered 2013-06-05 – 2013-06-06 (×2): 40 mg via INTRAVENOUS
  Filled 2013-06-05 (×4): qty 40

## 2013-06-05 MED ORDER — CHLORHEXIDINE GLUCONATE 0.12 % MT SOLN
15.0000 mL | Freq: Two times a day (BID) | OROMUCOSAL | Status: DC
  Administered 2013-06-05 – 2013-06-09 (×5): 15 mL via OROMUCOSAL
  Filled 2013-06-05 (×11): qty 15

## 2013-06-05 MED ORDER — POTASSIUM CHLORIDE 10 MEQ/50ML IV SOLN
INTRAVENOUS | Status: AC
  Administered 2013-06-05: 10 meq via INTRAVENOUS
  Filled 2013-06-05: qty 100

## 2013-06-05 MED ORDER — SODIUM CHLORIDE 0.9 % IJ SOLN
10.0000 mL | Freq: Two times a day (BID) | INTRAMUSCULAR | Status: DC
  Administered 2013-06-05 (×2): 10 mL

## 2013-06-05 MED ORDER — METOPROLOL TARTRATE 1 MG/ML IV SOLN: 2.5000 mg | INTRAVENOUS | Status: DC | PRN

## 2013-06-05 MED ORDER — HEPARIN (PORCINE) IN NACL 100-0.45 UNIT/ML-% IJ SOLN
1150.0000 [IU]/h | INTRAMUSCULAR | Status: DC
  Administered 2013-06-05 – 2013-06-06 (×2): 1150 [IU]/h via INTRAVENOUS
  Filled 2013-06-05 (×3): qty 250

## 2013-06-05 MED ORDER — BIOTENE DRY MOUTH MT LIQD
15.0000 mL | Freq: Four times a day (QID) | OROMUCOSAL | Status: DC
  Administered 2013-06-05 – 2013-06-06 (×4): 15 mL via OROMUCOSAL

## 2013-06-05 MED ORDER — METOPROLOL TARTRATE 1 MG/ML IV SOLN
5.0000 mg | Freq: Four times a day (QID) | INTRAVENOUS | Status: DC
  Administered 2013-06-06: 5 mg via INTRAVENOUS
  Filled 2013-06-05 (×6): qty 5

## 2013-06-05 MED FILL — Medication: Qty: 1 | Status: AC

## 2013-06-05 NOTE — Progress Notes (Signed)
ANTICOAGULATION CONSULT NOTE - Follow Up Consult  Pharmacy Consult for Heparin Indication: chest pain/ACS  Allergies  Allergen Reactions  . Food Other (See Comments)    Green beans unknown reaction  . Mushroom Extract Complex Other (See Comments)    unknown  . Pork-Derived Products Other (See Comments)    unknown    Patient Measurements: Height: 5' 6.14" (168 cm) Weight: 237 lb 7 oz (107.7 kg) IBW/kg (Calculated) : 59.63 Heparin Dosing Weight: 85 kg  Vital Signs: Temp: 100.3 F (37.9 C) (02/16 1800) Temp src: Core (Comment) (02/16 1600) BP: 101/64 mmHg (02/16 1800) Pulse Rate: 105 (02/16 1800)  Labs:  Recent Labs  06/04/13 1550 06/04/13 2125  06/05/13 0220 06/05/13 0330 06/05/13 0747 06/05/13 0955 06/05/13 1231 06/05/13 1700  HGB 15.6*  --   --  12.4  --   --   --   --   --   HCT 48.9*  --   --  38.4  --   --   --   --   --   PLT 558*  --   --  377  --   --   --   --   --   LABPROT  --  17.5*  --   --   --   --   --   --   --   INR  --  1.48  --   --   --   --   --   --   --   HEPARINUNFRC  --   --   --   --   --   --   --   --  0.62  CREATININE 0.80 1.03  < >  --  1.29*  --  1.26* 1.21* 1.10  TROPONINI  --  0.71*  --   --  2.25* 2.70*  --   --   --   < > = values in this interval not displayed.  Estimated Creatinine Clearance: 90.5 ml/min (by C-G formula based on Cr of 1.1).   Medications:  Heparin at 1150 units/hr  Assessment: 33 YOF on IV heparin for NSTEMI. Initial heparin level is therapeutic at at the upper end of goal. This was drawn 1 hour early but likely remains therapeutic. SCr has improved at 1.10. H/H is within normal limits.   Goal of Therapy:  Heparin level 0.3-0.7 units/ml Monitor platelets by anticoagulation protocol: Yes   Plan:  1. Continue heparin at 1150 units/hr. 2. Recheck heparin level in 6 hr to confirm.  Link SnufferJessica Darnelle Derrick, PharmD, BCPS Clinical Pharmacist [phone] 06/05/2013,6:34 PM

## 2013-06-05 NOTE — Consult Note (Addendum)
CARDIOLOGY CONSULT NOTE  Patient ID: Cathy Martin MRN: [id number] DOB/AGE: [birth year] [age] y.o.  Admit date: 06/04/2013 Primary Physician: Jacky KindleAronson Primary Cardiologist: New Reason for Consultation: Elevated troponin  HPI: 33 yo with history of type I diabetes x 22 years with insulin pump was admitted yesterday with DKA.  She was noted to have elevated troponin.  Patient was doing well until yesterday.  She woke up yesterday morning nauseated.  This became worse during the day.  She vomited multiple times.  She did not note fever, chills, cough, myalgias.  She did become lightheaded and went [facility] where she was found to be in DKA.  She was noted to be hypotensive and tachycardic with HR in the 150s-160s.  She received adenosine x 2 without effect and metoprolol IV which showed down her HR to 130s.  The only ECG with HR in the 150s that I can find from yesterday looks like sinus tachycardia.  She was acidotic with pH 6.9.  She was intubated and treated with dopamine + norepinephrine due to hypotension.  She was given IVF.  She improved overnight and is now off pressors and extubated.  She feels better today, not nauseated.  She did not have any chest pain.  No history of heart disease.  Troponin was checked during this episode and was elevated to 2.7.  Procalcitonin is 59 and she has had fever to 100.5.   Review of systems complete and found to be negative unless listed above in HPI  Past Medical History: Type I diabetes x 22 years.  No episode of DKA prior to this admission since she was initially diagnosed.   No family history on file.  History   Social History  . Marital Status: Married    Spouse Name: N/A    Number of Children: N/A  . Years of Education: N/A   Occupational History  . Not on file.   Social History Main Topics  . Smoking status: Never Smoker   . Smokeless tobacco: Not on file  . Alcohol Use: No  . Drug Use: No  . Sexual Activity: Not on file    Other Topics Concern  . Not on file   Social History Narrative  . No narrative on file     Prescriptions prior to admission  Medication Sig Dispense Refill  . Cholecalciferol (VITAMIN D-3 PO) Take 1 tablet by mouth daily.      . Insulin Human (INSULIN PUMP) 100 unit/ml SOLN Inject 1 each into the skin continuous. Pt uses Novolog insulin in pump.      Marland Kitchen. MAGNESIUM PO Take 1 tablet by mouth daily.       Scheduled Meds: . antiseptic oral rinse  15 mL Mouth Rinse QID  . [START ON 06/06/2013] aspirin EC  325 mg Oral Daily  . chlorhexidine  15 mL Mouth Rinse BID  . levofloxacin (LEVAQUIN) IV  750 mg Intravenous Q24H  . metoprolol  5 mg Intravenous 4 times per day  . pantoprazole (PROTONIX) IV  40 mg Intravenous Q24H  . potassium chloride  10 mEq Intravenous Q1 Hr x 2  . sodium chloride  10-40 mL Intracatheter Q12H   Continuous Infusions: . dextrose 5 % and 0.45 % NaCl with KCl 20 mEq/L 150 mL/hr at 06/05/13 1348  . heparin 1,150 Units/hr (06/05/13 1158)  . insulin (NOVOLIN-R) infusion 6.5 Units/hr (06/05/13 1302)   PRN Meds:.sodium chloride, dextrose, metoprolol, sodium chloride   Physical exam Blood pressure 100/57,  pulse 112, temperature 99.9 F (37.7 C), temperature source Core (Comment), resp. rate 26, height 5' 6.14" (1.68 m), weight 237 lb 7 oz (107.7 kg), last menstrual period 05/18/2013, SpO2 100.00%, unknown if currently breastfeeding. General: NAD Neck: JVP 8 cm, Thyroid appears diffusely enlarged.  Lungs: Clear to auscultation bilaterally with normal respiratory effort. CV: Nondisplaced PMI.  Heart regular S1/S2, no S3/S4, no murmur.  No peripheral edema.  No carotid bruit.  Normal pedal pulses.  Abdomen: Soft, nontender, no hepatosplenomegaly, no distention.  Skin: Intact without lesions or rashes.  Neurologic: Alert and oriented x 3.  Psych: Normal affect. Extremities: No clubbing or cyanosis.  HEENT: Normal.   Labs:   Lab Results  Component Value Date   WBC  35.5* 06/05/2013   HGB 12.4 06/05/2013   HCT 38.4 06/05/2013   MCV 89.5 06/05/2013   PLT 377 06/05/2013    Recent Labs Lab 06/04/13 2125  06/05/13 1231  NA 142  < > 139  K 5.0  < > 3.5*  CL 112  < > 113*  CO2 <7*  < > 13*  BUN 26*  < > 26*  CREATININE 1.03  < > 1.21*  CALCIUM 7.6*  < > 7.9*  PROT 6.1  --   --   BILITOT 0.2*  --   --   ALKPHOS 139*  --   --   ALT 12  --   --   AST 17  --   --   GLUCOSE 252*  < > 127*  < > = values in this interval not displayed. Lab Results  Component Value Date   TROPONINI 2.70* 06/05/2013  PCT 59  Radiology: - CXR: No acute findings  EKG: Sinus tachy, poor anterior R wave progression  ASSESSMENT AND PLAN:  33 yo with history of type I diabetes x 22 years with insulin pump was admitted yesterday with DKA.  She was noted to have elevated troponin. 1. Elevated troponin: I suspect that this was demand ischemia in the setting of severe acidosis, hypotension, and tachycardia with HR to the 160s.  No chest pain, no prior cardiac history.  ECG with poor R wave progression but ?issue with lead placement.  - Would follow troponin to peak.  - Repeat ECG - Can continue heparin gtt for now, if no symptoms and TnI trends down, would probably stop in the morning. - Can continue ASA for now.  - Would get echocardiogram.  If echo looks normal and no further symptoms/TnI trends down, would not pursue aggressive ischemic workup.  She could have ETT as outpatient.   2. DKA: Per primary team.  ? Trigger.  Suspect infection given low grade fever and very high procalcitonin.  Also, her diabetes is typically well-controlled.  3. Enlarged thyroid: Check TSH.  4. Tachycardia: HR to 160s apparently yesterday.  Adenosine given but did not help.  The only ECG I see was sinus tachycardia in the 150s, which may explain lack of response to adenosine.  Suspect she likely had profound sinus tachycardia in the setting of volume depletion/hypotension.  Will follow telemetry.    Marca Ancona 06/05/2013 2:09 PM

## 2013-06-05 NOTE — Progress Notes (Signed)
Name: Cathy Martin MRN: [id number] DOB: Aug 21, 1980    ADMISSION DATE:  06/04/2013  PRIMARY SERVICE: PCCM  CHIEF COMPLAINT:  Vomiting, DKA  BRIEF PATIENT DESCRIPTION:  33 years old female with PMH relevant for type 1 diabetes on insulin drip and adequate control at home. Presents with vomiting and hyperglycemia since 4 am. Found to be in severe DKA. No clear source of infection.   SIG EVENTS: 2/15 Admitted with DKA. Intubated for AMS and tachypnea. Suspected sepsis of unclear source - leukocytosis and elevated PCT 2/16 Extubated. Cardiac markers positive (TpI 2.70 ). Cards consult. Metoprolol, heparin and ASA initiated   LINES / TUBES:  EET 2/15 >> 2/16 L IJ CVC 2/15 >>  L radial A-line 2/15 >>    CULTURES:  RVP 2/15 >>  Blood 2/15 >>  Urine 2/15 >> UA negative  ANTIBIOTICS:  PCT 2/15: 47.81 , 2/16: 59.02 Vanc 2/15 >> 2/16 Pip-tazo 2/15 >> 2/16  Levofloxacin 2/16 >>    SUBJECTIVE:  RASS 0. No new complaints. Passed SBT and extubated. Tolerating   VITAL SIGNS: Temp:  [99.5 F (37.5 C)-100.7 F (38.2 C)] 100 F (37.8 C) (02/16 1500) Pulse Rate:  [107-168] 111 (02/16 1500) Resp:  [9-41] 28 (02/16 1500) BP: (77-176)/(32-113) 94/57 mmHg (02/16 1500) SpO2:  [91 %-100 %] 100 % (02/16 1500) FiO2 (%):  [30 %-100 %] 30 % (02/16 0930) Weight:  [102.513 kg (226 lb)-107.7 kg (237 lb 7 oz)] 107.7 kg (237 lb 7 oz) (02/16 0500) HEMODYNAMICS:   VENTILATOR SETTINGS: Vent Mode:  [-] PRVC FiO2 (%):  [30 %-100 %] 30 % Set Rate:  [12 bmp-30 bmp] 30 bmp Vt Set:  [480 mL-650 mL] 480 mL PEEP:  [5 cmH20] 5 cmH20 Plateau Pressure:  [15 cmH20-18 cmH20] 16 cmH20 INTAKE / OUTPUT: Intake/Output     02/15 0701 - 02/16 0700 02/16 0701 - 02/17 0700   I.V. (mL/kg) 6185.1 (57.4) 1839.2 (17.1)   IV Piggyback 200 250   Total Intake(mL/kg) 6385.1 (59.3) 2089.2 (19.4)   Urine (mL/kg/hr) 1845 360 (0.4)   Total Output 1845 360   Net +4540.1 +1729.2          PHYSICAL  EXAMINATION: General: Sedated, intubated, no acute distress. Eyes: Anicteric sclerae. Pupils are ENT: ETT in place. Trachea at midline.  Lymph: No cervical, supraclavicular, or axillary lymphadenopathy. Heart: Normal S1, S2. No murmurs, rubs, or gallops appreciated. No bruits, equal pulses. Lungs: Normal excursion, no dullness to percussion. Good air movement bilaterally, without wheezes or crackles.  Abdomen: Abdomen soft, non-tender and not distended, normoactive bowel sounds. No hepatosplenomegaly or masses. Musculoskeletal: No clubbing or synovitis. No LE edema Skin: No rashes or lesions Neuro: Patient is sedated but follows commands.    LABS: I have reviewed all of today's lab results. Relevant abnormalities are discussed in the A/P section  CXR: NACPD  ASSESSMENT / PLAN:  PULMONARY A: Acute respiratory failure, resolved P:   Monitor in ICU post extubation   CARDIOVASCULAR A:  Shock, septic and hypovolemic, resolved Type II NSTEMI P:  - Aggressive IVF resuscitation per DKA protocol  - Norepinephrine drip - Left radial A. Line in place - Will continue to follow serial troponin.   RENAL A:   Severe met acidosis due to DKA/lactate Lactic acidosis improving P:   Cont DKA Rx DC HCO3 infusion Monitor BMET intermittently Monitor I/Os Correct electrolytes as indicated   GASTROINTESTINAL A:   Nausea and vomiting, likely secondary to DKA  P:   SUP:  IV pantoprazole NPO post extubation  HEMATOLOGIC A:   No issues P:  - Will follow CBC  INFECTIOUS A:   Leukocytosis Elevated PCT - no clear source of infection P:   - Blood, sputum and urine cultures - RVP - Will get rotavirus antigen  ENDOCRINE A:   Severe DKA, unclear precipitant P:   Cont DKA protocol   NEUROLOGIC A:   Acute encephalopathy, resolved P:   Monitor Delirium prevention measures  TODAY'S SUMMARY:   I have personally obtained a history, examined the patient, evaluated  laboratory and imaging results, formulated the assessment and plan and placed orders. CRITICAL CARE: The patient is critically ill with multiple organ systems failure and requires high complexity decision making for assessment and support, frequent evaluation and titration of therapies, application of advanced monitoring technologies and extensive interpretation of multiple databases. Critical Care Time devoted to patient care services described in this note is 45 minutes.   Merton Border, MD ; [facility] [phone].  After 5:30 PM or weekends, call [phone]   06/05/2013, 3:54 PM

## 2013-06-05 NOTE — Progress Notes (Signed)
ANTICOAGULATION CONSULT NOTE - Initial Consult  Pharmacy Consult for Heparin Indication: chest pain/ACS  No Known Allergies  Patient Measurements: Height: 5' 6.14" (168 cm) Weight: 237 lb 7 oz (107.7 kg) IBW/kg (Calculated) : 59.63 Heparin Dosing Weight: 85 kg  Vital Signs: Temp: 100.2 F (37.9 C) (02/16 1018) Temp src: Core (Comment) (02/16 0700) BP: 106/61 mmHg (02/16 0930) Pulse Rate: 118 (02/16 1018)  Labs:  Recent Labs  06/04/13 1550 06/04/13 2125 06/04/13 2340 06/05/13 0130 06/05/13 0220 06/05/13 0330 06/05/13 0747  HGB 15.6*  --   --   --  12.4  --   --   HCT 48.9*  --   --   --  38.4  --   --   PLT 558*  --   --   --  377  --   --   LABPROT  --  17.5*  --   --   --   --   --   INR  --  1.48  --   --   --   --   --   CREATININE 0.80 1.03 1.11* 1.23*  --  1.29*  --   TROPONINI  --  0.71*  --   --   --  2.25* 2.70*    Estimated Creatinine Clearance: 77.2 ml/min (by C-G formula based on Cr of 1.29).   Medical History: Past Medical History  Diagnosis Date  . Diabetes mellitus without complication     Medications:  Scheduled:  . antiseptic oral rinse  15 mL Mouth Rinse QID  . [START ON 06/06/2013] aspirin EC  325 mg Oral Daily  . aspirin  300 mg Rectal Once  . chlorhexidine  15 mL Mouth Rinse BID  . levofloxacin (LEVAQUIN) IV  750 mg Intravenous Q24H  . metoprolol  5 mg Intravenous 4 times per day  . pantoprazole (PROTONIX) IV  40 mg Intravenous Q24H  . sodium chloride  10-40 mL Intracatheter Q12H    Assessment: 33 y.o. female admitted in severe DKA. Required intubation 2/15 but now extubated. Now found to have NSTEMI (trop up to 2.7). To begin heparin. Baseline INR 1.48 with no anticoagulation PTA. CBC stable. SQ heparin given at 0600 this a.m.  Goal of Therapy:  Heparin level 0.3-0.7 units/ml Monitor platelets by anticoagulation protocol: Yes   Plan:  1. Heparin IV bolus 4000 units 2. Heparin gtt at 1150 units/hr 3. Will f/u 6 hr heparin  level 4. Daily heparin level and CBC 5. D/c lovenox order  Christoper Fabianaron Kenzlee Fishburn, PharmD, BCPS Clinical pharmacist, pager [phone] 06/05/2013,10:26 AM

## 2013-06-05 NOTE — Progress Notes (Signed)
Utilization Review Completed.  

## 2013-06-05 NOTE — Progress Notes (Signed)
MD notified of AM ABG critical values - pH 7.163 and pCO2 18.4.  No orders received. Will continue to monitor pt.

## 2013-06-05 NOTE — Procedures (Signed)
Extubation Procedure Note  Patient Details:   Name: Cathy Martin DOB: [birth year] MRN: [id number]   Airway Documentation:  Airway 7.5 mm (Active)  Secured at (cm) 23 cm 06/05/2013  7:37 AM  Measured From Teeth 06/05/2013  7:37 AM  Secured Location Right 06/05/2013  7:37 AM  Secured By [employer] Tube Holder 06/05/2013  7:37 AM  Tube Holder Repositioned Yes 06/05/2013  7:37 AM  Cuff Pressure (cm H2O) 25 cm H2O 06/05/2013  7:37 AM  Site Condition Dry;Cool 06/04/2013  6:06 PM    Evaluation  O2 sats: stable throughout Complications: No apparent complications Patient did tolerate procedure well. Bilateral Breath Sounds: Rhonchi Suctioning: Oral Yes  [name], MA 06/05/2013, 10:18 AM

## 2013-06-05 NOTE — Progress Notes (Signed)
Echo Lab  2D Echocardiogram completed.  Demiyah Fischbach L Daysy Santini, RDCS 06/05/2013 1:34 PM

## 2013-06-05 NOTE — Significant Event (Signed)
1215pm-noted patient's latest K level was 3.5 that was drawn at 0955. Per DKA management guideline to give 2 runs of KCL. Patient next BMP is at 1300. RN drew BMP for 1300. Then spoke with Dr. Sung AmabileSimonds to make him aware of this. MD gave verbal order to give two runs of KCL and stated will add potassium in maintenance fluids.

## 2013-06-05 NOTE — Progress Notes (Signed)
eLink Physician-Brief Progress Note Patient Name: Cathy Martin DOB: Jun 05, 1980 MRN: [id number]  Date of Service  06/05/2013   HPI/Events of Note   VDRF.  Needs SUP.  eICU Interventions   Ordered protonix.   Intervention Category Intermediate Interventions: Best-practice therapies (e.g. DVT, beta blocker, etc.)  [name] 06/05/2013, 2:35 AM

## 2013-06-06 ENCOUNTER — Inpatient Hospital Stay (HOSPITAL_COMMUNITY): Payer: Managed Care, Other (non HMO)

## 2013-06-06 DIAGNOSIS — R778 Other specified abnormalities of plasma proteins: Secondary | ICD-10-CM

## 2013-06-06 DIAGNOSIS — I214 Non-ST elevation (NSTEMI) myocardial infarction: Secondary | ICD-10-CM

## 2013-06-06 DIAGNOSIS — A419 Sepsis, unspecified organism: Secondary | ICD-10-CM

## 2013-06-06 DIAGNOSIS — R7989 Other specified abnormal findings of blood chemistry: Secondary | ICD-10-CM

## 2013-06-06 DIAGNOSIS — I4729 Other ventricular tachycardia: Secondary | ICD-10-CM

## 2013-06-06 DIAGNOSIS — I472 Ventricular tachycardia: Secondary | ICD-10-CM

## 2013-06-06 LAB — BASIC METABOLIC PANEL
BUN: 18 mg/dL (ref 6–23)
BUN: 17 mg/dL (ref 6–23)
CALCIUM: 8.3 mg/dL — AB (ref 8.4–10.5)
CHLORIDE: 114 meq/L — AB (ref 96–112)
CO2: 16 meq/L — AB (ref 19–32)
CO2: 16 mEq/L — ABNORMAL LOW (ref 19–32)
Calcium: 8.1 mg/dL — ABNORMAL LOW (ref 8.4–10.5)
Chloride: 114 mEq/L — ABNORMAL HIGH (ref 96–112)
Creatinine, Ser: 1.07 mg/dL (ref 0.50–1.10)
Creatinine, Ser: 1.04 mg/dL (ref 0.50–1.10)
GFR calc Af Amer: 78 mL/min — ABNORMAL LOW (ref >90)
GFR calc non Af Amer: 67 mL/min — ABNORMAL LOW (ref >90)
GFR, EST AFRICAN AMERICAN: 81 mL/min — AB (ref >90)
GFR, EST NON AFRICAN AMERICAN: 70 mL/min — AB (ref >90)
GLUCOSE: 128 mg/dL — AB (ref 70–99)
GLUCOSE: 116 mg/dL — AB (ref 70–99)
Potassium: 3.6 mEq/L — ABNORMAL LOW (ref 3.7–5.3)
Potassium: 3.9 mEq/L (ref 3.7–5.3)
SODIUM: 140 meq/L (ref 137–147)
SODIUM: 141 meq/L (ref 137–147)

## 2013-06-06 LAB — GLUCOSE, CAPILLARY
GLUCOSE-CAPILLARY: 102 mg/dL — AB (ref 70–99)
GLUCOSE-CAPILLARY: 100 mg/dL — AB (ref 70–99)
GLUCOSE-CAPILLARY: 108 mg/dL — AB (ref 70–99)
GLUCOSE-CAPILLARY: 120 mg/dL — AB (ref 70–99)
GLUCOSE-CAPILLARY: 118 mg/dL — AB (ref 70–99)
GLUCOSE-CAPILLARY: 104 mg/dL — AB (ref 70–99)
Glucose-Capillary: 112 mg/dL — ABNORMAL HIGH (ref 70–99)
Glucose-Capillary: 113 mg/dL — ABNORMAL HIGH (ref 70–99)
Glucose-Capillary: 118 mg/dL — ABNORMAL HIGH (ref 70–99)
Glucose-Capillary: 120 mg/dL — ABNORMAL HIGH (ref 70–99)
Glucose-Capillary: 124 mg/dL — ABNORMAL HIGH (ref 70–99)
Glucose-Capillary: 93 mg/dL (ref 70–99)
Glucose-Capillary: 98 mg/dL (ref 70–99)
Glucose-Capillary: 136 mg/dL — ABNORMAL HIGH (ref 70–99)
Glucose-Capillary: 272 mg/dL — ABNORMAL HIGH (ref 70–99)
Glucose-Capillary: 324 mg/dL — ABNORMAL HIGH (ref 70–99)

## 2013-06-06 LAB — CBC
HCT: 33.9 % — ABNORMAL LOW (ref 36.0–46.0)
HEMOGLOBIN: 11.5 g/dL — AB (ref 12.0–15.0)
MCH: 28.5 pg (ref 26.0–34.0)
MCHC: 33.9 g/dL (ref 30.0–36.0)
MCV: 84.1 fL (ref 78.0–100.0)
Platelets: 234 10*3/uL (ref 150–400)
RBC: 4.03 MIL/uL (ref 3.87–5.11)
RDW: 14.7 % (ref 11.5–15.5)
WBC: 16 10*3/uL — ABNORMAL HIGH (ref 4.0–10.5)

## 2013-06-06 LAB — PROCALCITONIN: PROCALCITONIN: 26.55 ng/mL

## 2013-06-06 LAB — HEPARIN LEVEL (UNFRACTIONATED): Heparin Unfractionated: 0.55 IU/mL (ref 0.30–0.70)

## 2013-06-06 MED ORDER — PHENOL 1.4 % MT LIQD
1.0000 | OROMUCOSAL | Status: DC | PRN
  Administered 2013-06-06: 1 via OROMUCOSAL
  Filled 2013-06-06: qty 177

## 2013-06-06 MED ORDER — INSULIN ASPART 100 UNIT/ML ~~LOC~~ SOLN
0.0000 [IU] | Freq: Three times a day (TID) | SUBCUTANEOUS | Status: DC
  Administered 2013-06-06: 10 [IU] via SUBCUTANEOUS
  Administered 2013-06-07: 15 [IU] via SUBCUTANEOUS

## 2013-06-06 MED ORDER — INSULIN ASPART 100 UNIT/ML ~~LOC~~ SOLN
0.0000 [IU] | Freq: Three times a day (TID) | SUBCUTANEOUS | Status: DC
  Administered 2013-06-06: 8 [IU] via SUBCUTANEOUS

## 2013-06-06 MED ORDER — POTASSIUM CHLORIDE 10 MEQ/50ML IV SOLN
INTRAVENOUS | Status: AC
  Filled 2013-06-06: qty 50

## 2013-06-06 MED ORDER — LEVOFLOXACIN 750 MG PO TABS
750.0000 mg | ORAL_TABLET | Freq: Every day | ORAL | Status: DC
  Administered 2013-06-06 – 2013-06-09 (×4): 750 mg via ORAL
  Filled 2013-06-06 (×4): qty 1

## 2013-06-06 MED ORDER — INSULIN ASPART 100 UNIT/ML ~~LOC~~ SOLN: 0.0000 [IU] | Freq: Every day | SUBCUTANEOUS | Status: DC

## 2013-06-06 MED ORDER — ENOXAPARIN SODIUM 40 MG/0.4ML ~~LOC~~ SOLN
40.0000 mg | SUBCUTANEOUS | Status: DC
  Administered 2013-06-06 – 2013-06-09 (×4): 40 mg via SUBCUTANEOUS
  Filled 2013-06-06 (×4): qty 0.4

## 2013-06-06 MED ORDER — POTASSIUM CHLORIDE 10 MEQ/50ML IV SOLN
10.0000 meq | INTRAVENOUS | Status: AC
  Administered 2013-06-06 (×2): 10 meq via INTRAVENOUS
  Filled 2013-06-06 (×2): qty 50

## 2013-06-06 MED ORDER — METOPROLOL TARTRATE 25 MG PO TABS
25.0000 mg | ORAL_TABLET | Freq: Two times a day (BID) | ORAL | Status: DC
  Administered 2013-06-06 – 2013-06-09 (×7): 25 mg via ORAL
  Filled 2013-06-06 (×9): qty 1

## 2013-06-06 MED ORDER — POTASSIUM CHLORIDE 10 MEQ/50ML IV SOLN
10.0000 meq | INTRAVENOUS | Status: AC
  Administered 2013-06-06 (×2): 10 meq via INTRAVENOUS
  Filled 2013-06-06: qty 50

## 2013-06-06 MED ORDER — INSULIN GLARGINE 100 UNIT/ML ~~LOC~~ SOLN
10.0000 [IU] | Freq: Every day | SUBCUTANEOUS | Status: DC
  Administered 2013-06-06: 10 [IU] via SUBCUTANEOUS
  Filled 2013-06-06 (×2): qty 0.1

## 2013-06-06 NOTE — Progress Notes (Signed)
Patient Name: Cathy Martin Date of Encounter: 06/06/2013  Active Problems:   DKA (diabetic ketoacidoses)   Acute respiratory failure   Leukocytosis   Length of Stay: 2  SUBJECTIVE  Feeling better, just very tired  CURRENT MEDS . antiseptic oral rinse  15 mL Mouth Rinse QID  . aspirin EC  325 mg Oral Daily  . chlorhexidine  15 mL Mouth Rinse BID  . enoxaparin (LOVENOX) injection  40 mg Subcutaneous Q24H  . insulin aspart  0-15 Units Subcutaneous TID WC  . insulin aspart  0-5 Units Subcutaneous QHS  . insulin glargine  10 Units Subcutaneous Daily  . levofloxacin  750 mg Oral Daily  . metoprolol tartrate  25 mg Oral BID    OBJECTIVE   Intake/Output Summary (Last 24 hours) at 06/06/13 1157 Last data filed at 06/06/13 0900  Gross per 24 hour  Intake 4056.51 ml  Output   2010 ml  Net 2046.51 ml   Filed Weights   06/04/13 1849 06/05/13 0500  Weight: 102.513 kg (226 lb) 107.7 kg (237 lb 7 oz)    PHYSICAL EXAM Filed Vitals:   06/06/13 0400 06/06/13 0500 06/06/13 0600 06/06/13 0700  BP: 102/63 103/66 102/68 103/71  Pulse: 101 105 106 99  Temp: 100 F (37.8 C) 100 F (37.8 C) 100 F (37.8 C) 100.1 F (37.8 C)  TempSrc: Core (Comment) Core (Comment) Core (Comment) Core (Comment)  Resp: 23 21 23 17   Height:      Weight:      SpO2: 100% 99% 97% 97%   General: Alert, oriented x3, no distress Head: no evidence of trauma, PERRL, EOMI, no exophtalmos or lid lag, no myxedema, no xanthelasma; normal ears, nose and oropharynx Neck: normal jugular venous pulsations and no hepatojugular reflux; brisk carotid pulses without delay and no carotid bruits Chest: clear to auscultation, no signs of consolidation by percussion or palpation, normal fremitus, symmetrical and full respiratory excursions Cardiovascular: normal position and quality of the apical impulse, regular rhythm, normal first and second heart sounds, no rubs or gallops, no murmur Abdomen: no tenderness or  distention, no masses by palpation, no abnormal pulsatility or arterial bruits, normal bowel sounds, no hepatosplenomegaly Extremities: no clubbing, cyanosis or edema; 2+ radial, ulnar and brachial pulses bilaterally; 2+ right femoral, posterior tibial and dorsalis pedis pulses; 2+ left femoral, posterior tibial and dorsalis pedis pulses; no subclavian or femoral bruits Neurological: grossly nonfocal  LABS  CBC  Recent Labs  06/04/13 1550 06/05/13 0220 06/06/13 0522  WBC 33.8* 35.5* 16.0*  NEUTROABS 29.7*  --   --   HGB 15.6* 12.4 11.5*  HCT 48.9* 38.4 33.9*  MCV 88.9 89.5 84.1  PLT 558* 377 234   Basic Metabolic Panel  Recent Labs  06/04/13 1550 06/04/13 2125  06/06/13 0110 06/06/13 0522  NA 138 142  < > 140 141  K 5.4* 5.0  < > 3.6* 3.9  CL 97 112  < > 114* 114*  CO2 <7* <7*  < > 16* 16*  GLUCOSE 480* 252*  < > 128* 116*  BUN 28* 26*  < > 18 17  CREATININE 0.80 1.03  < > 1.07 1.04  CALCIUM 9.7 7.6*  < > 8.1* 8.3*  MG  --  2.3  --   --   --   PHOS  --  4.1  --   --   --   < > = values in this interval not displayed. Liver Function Tests  Recent Labs  06/04/13 1550 06/04/13 2125  AST 15 17  ALT 13 12  ALKPHOS 204* 139*  BILITOT 0.2* 0.2*  PROT 8.3 6.1  ALBUMIN 4.3 3.0*    Recent Labs  06/04/13 1550 06/04/13 2125  LIPASE 10* 15  AMYLASE  --  79   Cardiac Enzymes  Recent Labs  06/04/13 2125 06/05/13 0330 06/05/13 0747  TROPONINI 0.71* 2.25* 2.70*   BNP No components found with this basename: POCBNP,  D-Dimer No results found for this basename: DDIMER,  in the last 72 hours Hemoglobin A1C No results found for this basename: HGBA1C,  in the last 72 hours Fasting Lipid Panel No results found for this basename: CHOL, HDL, LDLCALC, TRIG, CHOLHDL, LDLDIRECT,  in the last 72 hours Thyroid Function Tests  Recent Labs  06/05/13 1500  TSH <0.008*    Radiology Studies Imaging results have been reviewed and Dg Chest Port 1 View  06/06/2013    CLINICAL DATA:  Respiratory failure  EXAM: PORTABLE CHEST - 1 VIEW  COMPARISON:  06/04/2013  FINDINGS: Endotracheal tube has been removed. Central line tip remains in the cavoatrial junction. No pneumothorax  Decreased lung volume with increase in bibasilar atelectasis.  Pulmonary vascular congestion and small pleural effusions.  IMPRESSION: Following extubation, there is increase in bibasilar atelectasis with decreased lung volume  Vascular congestion with small pleural effusions have developed since 06/04/2013   Electronically Signed   By: Marlan Palauharles  Clark M.D.   On: 06/06/2013 07:52   Dg Chest Port 1 View  06/04/2013   CLINICAL DATA:  Central line placement.  EXAM: PORTABLE CHEST - 1 VIEW  COMPARISON:  06/04/2013  FINDINGS: Left internal jugular central line is in place. The tip is at the cavoatrial junction. No pneumothorax.  Endotracheal tube is in stable position. Improving aeration of the lungs. No confluent opacities or effusions.  IMPRESSION: Left central line placement with the tip at the cavoatrial junction. No pneumothorax.  Improved aeration of the lungs.   Electronically Signed   By: Charlett NoseKevin  Dover M.D.   On: 06/04/2013 23:07   Dg Chest Portable 1 View  06/04/2013   CLINICAL DATA:  ET tube placement.  EXAM: PORTABLE CHEST - 1 VIEW  COMPARISON:  Chest 06/04/2013 at 1700 hr.  FINDINGS: Endotracheal tube is in place with the tip in good position at the level of the clavicular heads. Lungs are clear. Heart size is normal. No pneumothorax or pleural effusion.  IMPRESSION: ET tube in good position.  Lungs clear.   Electronically Signed   By: Drusilla Kannerhomas  Dalessio M.D.   On: 06/04/2013 18:27   Dg Chest Port 1 View  06/04/2013   CLINICAL DATA:  Emesis.  EXAM: PORTABLE CHEST - 1 VIEW  COMPARISON:  November 26, 2005.  FINDINGS: The heart size and mediastinal contours are within normal limits. Both lungs are clear. The visualized skeletal structures are unremarkable.  IMPRESSION: No active disease.   Electronically  Signed   By: Roque LiasJames  Green M.D.   On: 06/04/2013 17:12    TELE Sinus rhythm One brief episode of 7-beat NSVT Normal wall motion and EF 50-55% by echo  ECG NSR, long QT  ASSESSMENT AND PLAN No evidence for true ACS other than mild troponin elevation. This was most likely a consequence of extreme biochemical and hemodynamic stress, very low suspicion for a true coronary event. It is reasonable for her to undergo an outpatient treadmill stress test, nonurgent. She has a normal baseline to compare to (performed after an episode of  preeclampsia/CHF a few years ago). Please let us know when she is ready for DC to schedule ETT and f/u with Dr. Ronelle Nigh.   Thurmon Fair, MD, [facility] [phone] office [phone] pager 06/06/2013 11:57 AM

## 2013-06-06 NOTE — Progress Notes (Signed)
Pt with a quarter size spot on back of her left upper thigh; skin has sloughed off and area is pink and tender around the site; no drainage noted at this time; Dr. Sung AmabileSimonds paged and made aware, pt will be seen tomorrow; area covered with vaseline gauze and foam dressing; will continue to monitor

## 2013-06-06 NOTE — Progress Notes (Signed)
eLink Physician-Brief Progress Note Patient Name: Cathy RetortSamantha N Ralph DOB: 07-08-1980 MRN: [id number]  Date of Service  06/06/2013   HPI/Events of Note   Sore throat hyperglycemia  eICU Interventions  Chloraseptic spray Change SSI to AC/HS   Intervention Category Intermediate Interventions: Hyperglycemia - evaluation and treatment Minor Interventions: Routine modifications to care plan (e.g. PRN medications for pain, fever)  Gideon Burstein 06/06/2013, 7:59 PM

## 2013-06-06 NOTE — Progress Notes (Signed)
ANTICOAGULATION CONSULT NOTE - Follow Up Consult  Pharmacy Consult for Heparin Indication: chest pain/ACS  Allergies  Allergen Reactions  . Food Other (See Comments)    Green beans unknown reaction  . Mushroom Extract Complex Other (See Comments)    unknown  . Pork-Derived Products Other (See Comments)    unknown    Patient Measurements: Height: 5' 6.14" (168 cm) Weight: 237 lb 7 oz (107.7 kg) IBW/kg (Calculated) : 59.63 Heparin Dosing Weight: 85 kg  Vital Signs: Temp: 100.1 F (37.8 C) (02/17 0700) Temp src: Core (Comment) (02/17 0700) BP: 103/71 mmHg (02/17 0700) Pulse Rate: 99 (02/17 0700)  Labs:  Recent Labs  06/04/13 1550 06/04/13 2125  06/05/13 0220 06/05/13 0330 06/05/13 0747  06/05/13 1231 06/05/13 1700 06/05/13 2153 06/06/13 0110 06/06/13 0522  HGB 15.6*  --   --  12.4  --   --   --   --   --   --   --  11.5*  HCT 48.9*  --   --  38.4  --   --   --   --   --   --   --  33.9*  PLT 558*  --   --  377  --   --   --   --   --   --   --  234  LABPROT  --  17.5*  --   --   --   --   --   --   --   --   --   --   INR  --  1.48  --   --   --   --   --   --   --   --   --   --   HEPARINUNFRC  --   --   --   --   --   --   --   --  0.62  --  0.55  --   CREATININE 0.80 1.03  < >  --  1.29*  --   < > 1.21* 1.10 1.08 1.07 1.04  TROPONINI  --  0.71*  --   --  2.25* 2.70*  --   --   --   --   --   --   < > = values in this interval not displayed.  Estimated Creatinine Clearance: 95.7 ml/min (by C-G formula based on Cr of 1.04).   Medications:  Heparin at 1150 units/hr  Assessment: 33 YOF on IV heparin for NSTEMI. Heparin remains therapeutic. CBC remains stable. No bleeding noted. Plans per cardiology note to stop heparin if no symptoms and troponin trending down. Elevated troponin thought to be due to demand ischemia.  Goal of Therapy:  Heparin level 0.3-0.7 units/ml Monitor platelets by anticoagulation protocol: Yes   Plan:  1. Continue heparin at 1150  units/hr. 2. F/u daily heparin level and CBC  Christoper Fabianaron Laneya Gasaway, PharmD, BCPS Clinical pharmacist, pager [phone] 06/06/2013,8:42 AM

## 2013-06-06 NOTE — Progress Notes (Signed)
    Name: Cathy RetortSamantha N Osland MRN: [id number] DOB: [birth year]    ADMISSION DATE:  06/04/2013  PRIMARY SERVICE: PCCM  CHIEF COMPLAINT:  Vomiting, DKA  BRIEF PATIENT DESCRIPTION:  33 years old female with PMH relevant for type 1 diabetes on insulin drip and adequate control at home. Presents with vomiting and hyperglycemia since 4 am. Found to be in severe DKA. No clear source of infection.   SIG EVENTS: 2/15 Admitted with DKA. Intubated for AMS and tachypnea. Suspected sepsis of unclear source - leukocytosis and elevated PCT 2/16 Extubated. Cardiac markers positive (TpI 2.70 ). Metoprolol, heparin and ASA initiated.  Cards consult - needs outpt stress test scheduled. Please call them @ time of discharge to arrange 2/17 Seven beat NSVT. No further eval per Cards. Transferred to telemetry.  2/18 Care to be assumed by Dr Lanell MatarAronson's colleagues and PCCM to sign off  LINES / TUBES:  ETT 2/15 >> 2/16 L IJ CVC 2/15 >> 2/17 L radial A-line 2/15 >> 2/16   CULTURES:  RVP 2/15 >> NEG Urine 2/15 >> UA negative Blood 2/15 >>    ANTIBIOTICS:  PCT 2/15: 47.81 , 2/16: 59.02,   2/17: 26.55 Vanc 2/15 >> 2/16 Pip-tazo 2/15 >> 2/16  Levofloxacin 2/16 >>    SUBJECTIVE:  No distress. No new complaints.  Seven beat run of NSVT - aymptomatuc   VITAL SIGNS: Temp:  [98.3 F (36.8 C)-100.4 F (38 C)] 99.9 F (37.7 C) (02/17 1320) Pulse Rate:  [91-121] 98 (02/17 1320) Resp:  [3-31] 20 (02/17 1320) BP: (94-117)/(57-77) 111/70 mmHg (02/17 1320) SpO2:  [97 %-100 %] 99 % (02/17 1320)    INTAKE / OUTPUT: Intake/Output     02/16 0701 - 02/17 0700 02/17 0701 - 02/18 0700   I.V. (mL/kg) 4568.5 (42.4) 379.5 (3.5)   IV Piggyback 350 100   Total Intake(mL/kg) 4918.5 (45.7) 479.5 (4.5)   Urine (mL/kg/hr) 2025 (0.8) 800 (1.1)   Total Output 2025 800   Net +2893.5 -320.5          PHYSICAL EXAMINATION: General: RASS 0, NAD HEENT: WNL Heart: RRR s M Lungs: clear Abdomen: Soft, NABS Ext: no  edema Neuro: No focal deficits   LABS: I have reviewed all of today's lab results. Relevant abnormalities are discussed in the A/P section  CXR: LLL atx  ASSESSMENT:   DKA - resolve   Diabetes - type I   Acute respiratory failure, resolved   Leukocytosis, elevated PCT - unclear etiology   Elevated troponin I level - thought to represent type II NSTEMI   NSVT (nonsustained ventricular tachycardia)  PLAN: Transition off insulin gtt to Lantus and SSI Cont levofloxacin for now - suggest empiric 7 day course F/U culture results Mobilize Cards input appreciated - rec: outpt stress test DC heparin infusion Cont ASA, metoprolol Consider statin - should ask for Cards input Transfer to Telemetry bed GMA to assume care as of 2/18 AM and PCCM to sign off. Spoke to Dr Clelia CroftShaw   Billy Fischeravid Orvie Caradine, MD ; [facility] service Mobile [phone].  After 5:30 PM or weekends, call [phone]

## 2013-06-07 LAB — CBC
HCT: 38.7 % (ref 36.0–46.0)
HEMOGLOBIN: 12.7 g/dL (ref 12.0–15.0)
MCH: 28.5 pg (ref 26.0–34.0)
MCHC: 32.8 g/dL (ref 30.0–36.0)
MCV: 87 fL (ref 78.0–100.0)
Platelets: 248 10*3/uL (ref 150–400)
RBC: 4.45 MIL/uL (ref 3.87–5.11)
RDW: 15.5 % (ref 11.5–15.5)
WBC: 15.7 10*3/uL — ABNORMAL HIGH (ref 4.0–10.5)

## 2013-06-07 LAB — BASIC METABOLIC PANEL
BUN: 27 mg/dL — AB (ref 6–23)
CHLORIDE: 107 meq/L (ref 96–112)
CO2: 11 mEq/L — ABNORMAL LOW (ref 19–32)
Calcium: 9.6 mg/dL (ref 8.4–10.5)
Creatinine, Ser: 1.03 mg/dL (ref 0.50–1.10)
GFR calc non Af Amer: 71 mL/min — ABNORMAL LOW (ref >90)
GFR, EST AFRICAN AMERICAN: 82 mL/min — AB (ref >90)
Glucose, Bld: 406 mg/dL — ABNORMAL HIGH (ref 70–99)
POTASSIUM: 4.9 meq/L (ref 3.7–5.3)
Sodium: 139 mEq/L (ref 137–147)

## 2013-06-07 LAB — GLUCOSE, CAPILLARY
GLUCOSE-CAPILLARY: 107 mg/dL — AB (ref 70–99)
GLUCOSE-CAPILLARY: 105 mg/dL — AB (ref 70–99)
GLUCOSE-CAPILLARY: 129 mg/dL — AB (ref 70–99)
GLUCOSE-CAPILLARY: 106 mg/dL — AB (ref 70–99)
GLUCOSE-CAPILLARY: 99 mg/dL (ref 70–99)
GLUCOSE-CAPILLARY: 102 mg/dL — AB (ref 70–99)
GLUCOSE-CAPILLARY: 353 mg/dL — AB (ref 70–99)
GLUCOSE-CAPILLARY: 227 mg/dL — AB (ref 70–99)
GLUCOSE-CAPILLARY: 319 mg/dL — AB (ref 70–99)
GLUCOSE-CAPILLARY: 213 mg/dL — AB (ref 70–99)
Glucose-Capillary: 118 mg/dL — ABNORMAL HIGH (ref 70–99)
Glucose-Capillary: 124 mg/dL — ABNORMAL HIGH (ref 70–99)
Glucose-Capillary: 368 mg/dL — ABNORMAL HIGH (ref 70–99)
Glucose-Capillary: 217 mg/dL — ABNORMAL HIGH (ref 70–99)
Glucose-Capillary: 172 mg/dL — ABNORMAL HIGH (ref 70–99)
Glucose-Capillary: 140 mg/dL — ABNORMAL HIGH (ref 70–99)

## 2013-06-07 LAB — TROPONIN I: Troponin I: 3.36 ng/mL (ref <0.30)

## 2013-06-07 LAB — PROCALCITONIN: PROCALCITONIN: 7.87 ng/mL

## 2013-06-07 MED ORDER — SODIUM CHLORIDE 0.9 % IV SOLN
INTRAVENOUS | Status: DC
  Administered 2013-06-07 – 2013-06-09 (×2): via INTRAVENOUS

## 2013-06-07 MED ORDER — INSULIN REGULAR BOLUS VIA INFUSION
0.0000 [IU] | Freq: Three times a day (TID) | INTRAVENOUS | Status: DC
  Administered 2013-06-07 (×2): 3 [IU] via INTRAVENOUS
  Administered 2013-06-08: 4.2 [IU] via INTRAVENOUS
  Filled 2013-06-07: qty 10

## 2013-06-07 MED ORDER — SODIUM CHLORIDE 0.9 % IV SOLN
INTRAVENOUS | Status: DC
  Administered 2013-06-07: 1.7 [IU]/h via INTRAVENOUS
  Administered 2013-06-08: 4.1 [IU]/h via INTRAVENOUS
  Filled 2013-06-07 (×3): qty 1

## 2013-06-07 MED ORDER — DEXTROSE 50 % IV SOLN
25.0000 mL | INTRAVENOUS | Status: DC | PRN
Start: 2013-06-07 — End: 2013-06-09

## 2013-06-07 MED ORDER — DEXTROSE-NACL 5-0.45 % IV SOLN
INTRAVENOUS | Status: DC
Start: 2013-06-07 — End: 2013-06-09
  Administered 2013-06-07 – 2013-06-08 (×2): via INTRAVENOUS

## 2013-06-07 NOTE — Progress Notes (Signed)
Subjective: Overall better but increasing nausea this am, cbg up, not feeling as well No chestpain, no abd pain  Objective: Vital signs in last 24 hours: Temp:  [98.1 F (36.7 C)-98.6 F (37 C)] 98.6 F (37 C) (02/18 0654) Pulse Rate:  [98-113] 98 (02/18 1057) Resp:  [20] 20 (02/18 0654) BP: (108-141)/(64-105) 108/64 mmHg (02/18 1057) SpO2:  [98 %-99 %] 98 % (02/18 0654) Weight change:   CBG (last 3)   Recent Labs  06/07/13 0742 06/07/13 1033 06/07/13 1135  GLUCAP 353* 227* 217*    Intake/Output from previous day: 02/17 0701 - 02/18 0700 In: 899.5 [P.O.:420; I.V.:379.5; IV Piggyback:100] Out: 800 [Urine:800]  Physical Exam: Awake, alert, no distress Neck no jvd, supple Chest clear RRR-no murmur Abdomen soft and nontender-good bs Neuro normal   Lab Results:  Recent Labs  06/04/13 1550 06/04/13 2125  06/06/13 0522 06/07/13 0618  NA 138 142  < > 141 139  K 5.4* 5.0  < > 3.9 4.9  CL 97 112  < > 114* 107  CO2 <7* <7*  < > 16* 11*  GLUCOSE 480* 252*  < > 116* 406*  BUN 28* 26*  < > 17 27*  CREATININE 0.80 1.03  < > 1.04 1.03  CALCIUM 9.7 7.6*  < > 8.3* 9.6  MG  --  2.3  --   --   --   PHOS  --  4.1  --   --   --   < > = values in this interval not displayed.  Recent Labs  06/04/13 1550 06/04/13 2125  AST 15 17  ALT 13 12  ALKPHOS 204* 139*  BILITOT 0.2* 0.2*  PROT 8.3 6.1  ALBUMIN 4.3 3.0*    Recent Labs  06/04/13 1550  06/06/13 0522 06/07/13 0618  WBC 33.8*  < > 16.0* 15.7*  NEUTROABS 29.7*  --   --   --   HGB 15.6*  < > 11.5* 12.7  HCT 48.9*  < > 33.9* 38.7  MCV 88.9  < > 84.1 87.0  PLT 558*  < > 234 248  < > = values in this interval not displayed. Lab Results  Component Value Date   INR 1.48 06/04/2013    Recent Labs  06/05/13 0330 06/05/13 0747 06/07/13 0618  TROPONINI 2.25* 2.70* 3.36*    Recent Labs  06/05/13 1500  TSH <0.008*   No results found for this basename: VITAMINB12, FOLATE, FERRITIN, TIBC, IRON,  RETICCTPCT,  in the last 72 hours  Studies/Results: Dg Chest Port 1 View  06/06/2013   CLINICAL DATA:  Respiratory failure  EXAM: PORTABLE CHEST - 1 VIEW  COMPARISON:  06/04/2013  FINDINGS: Endotracheal tube has been removed. Central line tip remains in the cavoatrial junction. No pneumothorax  Decreased lung volume with increase in bibasilar atelectasis.  Pulmonary vascular congestion and small pleural effusions.  IMPRESSION: Following extubation, there is increase in bibasilar atelectasis with decreased lung volume  Vascular congestion with small pleural effusions have developed since 06/04/2013   Electronically Signed   By: Marlan Palauharles  Clark M.D.   On: 06/06/2013 07:52     Assessment/Plan: 1. DKA- not resolved-co2 depressed, nauseated--reinstitute insulin drip and ivf untli cleared 2. Respiratory failure-stable  Insulin drip, ivf   LOS: 3 days   Cathy Martin 06/07/2013, 1:25 PM

## 2013-06-07 NOTE — Progress Notes (Addendum)
Inpatient Diabetes Program Recommendations  AACE/ADA: New Consensus Statement on Inpatient Glycemic Control (2013)  Target Ranges:  Prepandial:   less than 140 mg/dL      Peak postprandial:   less than 180 mg/dL (1-2 hours)      Critically ill patients:  140 - 180 mg/dL     Results for Cathy Martin, Cathy Martin (MRN [id number]) as of 06/07/2013 08:04  Ref. Range 06/07/2013 06:18  Sodium Latest Range: 137-147 mEq/L 139  Potassium Latest Range: 3.7-5.3 mEq/L 4.9  Chloride Latest Range: 96-112 mEq/L 107  CO2 Latest Range: 19-32 mEq/L 11 (L)  BUN Latest Range: 6-23 mg/dL 27 (H)  Creatinine Latest Range: 0.50-1.10 mg/dL 6.961.03  Calcium Latest Range: 8.4-10.5 mg/dL 9.6  GFR calc non Af Amer Latest Range: >90 mL/min 71 (L)  GFR calc Af Amer Latest Range: >90 mL/min 82 (L)  Glucose Latest Range: 70-99 mg/dL 295406 (H)     **Patient was admitted in DKA.  Transitioned off IV insulin drip yesterday around 12 noon.  [facility]  Patient also getting Novolog Moderate SSI.  **Concern that patient is back in DKA.  CO2 is 11 this morning with glucose of 406 mg/dl.  Anion gap calculated from above BMET is 21.  Note that Dr. Jacky Martin is scheduled to see patient this morning.  **Spoke with Cathy Ralphsesar RN caring for patient today.  Per RN, Dr. Jacky Martin is planning to start patient back on IV insulin drip this morning.  Will follow. Cathy FinlandJeannine Martin Cathy Mczeal RN, MSN, CDE Diabetes Coordinator Inpatient Diabetes Program Team Pager: [phone] (8a-10p)  1130am Addendum:  Spoke with patient this morning about her current lab results, glucose levels, etc.  Patient told me that she spoke with Dr. Jacky Martin this morning and that he explained why she would be restarted on an IV insulin drip.  Asked patient if she could review her insulin pump settings with me.  Patient told me she gets 46.8 units total of basal insulin in a 24 hour period.  Patient asked me to call Cathy Martin  at her PCP's office to get all of the details of her pump settings.  Instructed patient and husband that they will need to use fresh insulin and fresh insulin pump supplies (reservoir/tubing/insertion site, etc) when patient eventually restarts her insulin pump.  Explained to patient that Dr. Jacky Martin may likely have her resume her insulin pump as soon as her acidosis is clear and her CBGs are stable.  Patient did not have any questions for me at this time.  Called [facility] and spoke with Cathy Martin.  Cathy Martin reviewed Cathy Martin chart and told me that patient used the following insulin pump settings: Basal rates 12AM- 1.8 units/hr 3AM- 2.0 units/hr 10PM- 1.7 units/hr  Total 46.8 units basal insulin in 24 hour period  Carbohydrate ratio is 1 unit for every 5 grams of carbs  Correction factor is 1 unit for every 20 mg/dl above her target CBG  Called Dr. Lanell MatarAronson's office to inquire about frequency of BMETs desired (no BMETs ordered at this time).  Dr. Lanell MatarAronson's office gave me order to place orders for BMET and CBC for tomorrow AM (02/19).  Orders placed.  Spoke with Cathy Ralphsesar RN caring for patient today.  Clarified orders with him.  Patient switched to D5NS this morning once CBG dropped below 250 mg/dl.

## 2013-06-08 LAB — GLUCOSE, CAPILLARY
GLUCOSE-CAPILLARY: 309 mg/dL — AB (ref 70–99)
GLUCOSE-CAPILLARY: 164 mg/dL — AB (ref 70–99)
GLUCOSE-CAPILLARY: 163 mg/dL — AB (ref 70–99)
GLUCOSE-CAPILLARY: 152 mg/dL — AB (ref 70–99)
GLUCOSE-CAPILLARY: 153 mg/dL — AB (ref 70–99)
GLUCOSE-CAPILLARY: 181 mg/dL — AB (ref 70–99)
GLUCOSE-CAPILLARY: 151 mg/dL — AB (ref 70–99)
GLUCOSE-CAPILLARY: 159 mg/dL — AB (ref 70–99)
GLUCOSE-CAPILLARY: 160 mg/dL — AB (ref 70–99)
Glucose-Capillary: 129 mg/dL — ABNORMAL HIGH (ref 70–99)
Glucose-Capillary: 132 mg/dL — ABNORMAL HIGH (ref 70–99)
Glucose-Capillary: 140 mg/dL — ABNORMAL HIGH (ref 70–99)
Glucose-Capillary: 146 mg/dL — ABNORMAL HIGH (ref 70–99)
Glucose-Capillary: 150 mg/dL — ABNORMAL HIGH (ref 70–99)
Glucose-Capillary: 150 mg/dL — ABNORMAL HIGH (ref 70–99)
Glucose-Capillary: 153 mg/dL — ABNORMAL HIGH (ref 70–99)
Glucose-Capillary: 157 mg/dL — ABNORMAL HIGH (ref 70–99)
Glucose-Capillary: 175 mg/dL — ABNORMAL HIGH (ref 70–99)
Glucose-Capillary: 187 mg/dL — ABNORMAL HIGH (ref 70–99)
Glucose-Capillary: 198 mg/dL — ABNORMAL HIGH (ref 70–99)
Glucose-Capillary: 226 mg/dL — ABNORMAL HIGH (ref 70–99)
Glucose-Capillary: 231 mg/dL — ABNORMAL HIGH (ref 70–99)

## 2013-06-08 LAB — BASIC METABOLIC PANEL
BUN: 17 mg/dL (ref 6–23)
BUN: 14 mg/dL (ref 6–23)
CALCIUM: 8.3 mg/dL — AB (ref 8.4–10.5)
CALCIUM: 9 mg/dL (ref 8.4–10.5)
CHLORIDE: 110 meq/L (ref 96–112)
CO2: 19 mEq/L (ref 19–32)
CO2: 19 meq/L (ref 19–32)
CREATININE: 0.77 mg/dL (ref 0.50–1.10)
Chloride: 113 mEq/L — ABNORMAL HIGH (ref 96–112)
Creatinine, Ser: 0.78 mg/dL (ref 0.50–1.10)
GFR calc Af Amer: >90 mL/min (ref >90)
GFR calc Af Amer: >90 mL/min (ref >90)
GFR calc non Af Amer: >90 mL/min (ref >90)
GLUCOSE: 168 mg/dL — AB (ref 70–99)
Glucose, Bld: 175 mg/dL — ABNORMAL HIGH (ref 70–99)
Potassium: 3.3 mEq/L — ABNORMAL LOW (ref 3.7–5.3)
Potassium: 3.5 mEq/L — ABNORMAL LOW (ref 3.7–5.3)
Sodium: 143 mEq/L (ref 137–147)
Sodium: 142 mEq/L (ref 137–147)

## 2013-06-08 LAB — CBC
HCT: 33.1 % — ABNORMAL LOW (ref 36.0–46.0)
Hemoglobin: 11.1 g/dL — ABNORMAL LOW (ref 12.0–15.0)
MCH: 28.5 pg (ref 26.0–34.0)
MCHC: 33.5 g/dL (ref 30.0–36.0)
MCV: 85.1 fL (ref 78.0–100.0)
PLATELETS: 202 10*3/uL (ref 150–400)
RBC: 3.89 MIL/uL (ref 3.87–5.11)
RDW: 15.2 % (ref 11.5–15.5)
WBC: 6.7 10*3/uL (ref 4.0–10.5)

## 2013-06-08 MED ORDER — INSULIN PUMP
Freq: Three times a day (TID) | SUBCUTANEOUS | Status: DC
  Administered 2013-06-08: 11.2 via SUBCUTANEOUS
  Administered 2013-06-08: 12 via SUBCUTANEOUS
  Filled 2013-06-08: qty 1

## 2013-06-08 NOTE — Progress Notes (Signed)
Utilization review completed.  

## 2013-06-08 NOTE — Progress Notes (Signed)
Subjective: Looks great feels much better just fatigued  Objective: Vital signs in last 24 hours: Temp:  [98.2 F (36.8 C)-99.1 F (37.3 C)] 99.1 F (37.3 C) (02/19 0605) Pulse Rate:  [77-98] 85 (02/19 0605) Resp:  [20-21] 20 (02/19 0605) BP: (107-122)/(64-73) 121/72 mmHg (02/19 0605) SpO2:  [95 %-99 %] 95 % (02/19 0605) Weight:  [106.988 kg (235 lb 13.9 oz)] 106.988 kg (235 lb 13.9 oz) (02/19 0605) Weight change:   CBG (last 3)   Recent Labs  06/08/13 0459 06/08/13 0604 06/08/13 0710  GLUCAP 153* 181* 198*    Intake/Output from previous day:    Physical Exam: Alert awake no distress bright interactive Good facial symmetry No JVD Chest is clear Cardiovascular regular rate and rhythm no murmur Abdomen soft and nontender normal bowel sounds Extremities no cyanosis clubbing or edema intact distal pulses Neurologic exam is normal Skin is normal except for a lesion on the posterior aspect of the left thigh was like a vesicle it is ruptured and abraded no active infection   Lab Results:  Recent Labs  06/07/13 0618 06/08/13 0450  NA 139 143  K 4.9 3.3*  CL 107 113*  CO2 11* 19  GLUCOSE 406* 175*  BUN 27* 17  CREATININE 1.03 0.78  CALCIUM 9.6 8.3*   No results found for this basename: AST, ALT, ALKPHOS, BILITOT, PROT, ALBUMIN,  in the last 72 hours  Recent Labs  06/07/13 0618 06/08/13 0450  WBC 15.7* 6.7  HGB 12.7 11.1*  HCT 38.7 33.1*  MCV 87.0 85.1  PLT 248 202   Lab Results  Component Value Date   INR 1.48 06/04/2013    Recent Labs  06/07/13 0618  TROPONINI 3.36*    Recent Labs  06/05/13 1500  TSH <0.008*   No results found for this basename: VITAMINB12, FOLATE, FERRITIN, TIBC, IRON, RETICCTPCT,  in the last 72 hours  Studies/Results: No results found.   Assessment/Plan: #1 diabetic ketoacidosis slowly resolving we'll try to transition back to her insulin pump later today depending upon her be met  #2 diabetes mellitus type 1  long-standing insulin pump typically well controlled  #3 probable viral syndrome  #4 metabolic arrangements and respiratory failure secondary to #1 resolved   Will ambulate hopefully transition off of the insulin drip today look towards discharge tomorrow   LOS: 4 days   Zaydrian Batta A 06/08/2013, 10:20 AM

## 2013-06-08 NOTE — Progress Notes (Addendum)
Inpatient Diabetes Program Recommendations  AACE/ADA: New Consensus Statement on Inpatient Glycemic Control (2013)  Target Ranges:  Prepandial:   less than 140 mg/dL      Peak postprandial:   less than 180 mg/dL (1-2 hours)      Critically ill patients:  140 - 180 mg/dL     Results for Cathy Martin, Cathy Martin (MRN [id number]) as of 06/08/2013 08:38  Ref. Range 06/08/2013 04:50  Sodium Latest Range: 137-147 mEq/L 143  Potassium Latest Range: 3.7-5.3 mEq/L 3.3 (L)  Chloride Latest Range: 96-112 mEq/L 113 (H)  CO2 Latest Range: 19-32 mEq/L 19  BUN Latest Range: 6-23 mg/dL 17  Creatinine Latest Range: 0.50-1.10 mg/dL 3.080.78  Calcium Latest Range: 8.4-10.5 mg/dL 8.3 (L)  GFR calc non Af Amer Latest Range: >90 mL/min >90  GFR calc Af Amer Latest Range: >90 mL/min >90  Glucose Latest Range: 70-99 mg/dL 657175 (H)     **CO2 much improved today (back up to 19).  Anion Gap 11 based on above BMET results.  **Dr. Jacky KindleAronson to see patient today.  Hopefully patient will be able to resume her insulin pump this morning.  Patient has all fresh insulin pump supplies here in hospital.  If decision made by MD to allow patient to resume her insulin pump, please have patient resume her pump and overlap the IV insulin drip and her pump start by one hour.  Once one hour has passed, RN can d/c IV insulin drip and GlucoStabilizer.    Will follow. Ambrose FinlandJeannine Johnston Andilynn Delavega RN, MSN, CDE Diabetes Coordinator Inpatient Diabetes Program Team Pager: [phone] (8a-10p)    Addendum 1100am:  Spoke with patient this morning.  Patient said Dr. Jacky KindleAronson told her she may be able to resume her insulin pump this afternoon pending her BMET results from 2pm.  Explained to patient that ideally we would like for her insulin pump and the IV insulin drip to overlap for one hour.  If Dr. Jacky KindleAronson allows patient to resume her insulin pump, asked patient to notify RN when pump is restarted so that RN can turn IV insulin drip off one hour after  insulin pump started back.  Wrote down patient's basal rates for her on a piece of paper so patient can make sure her basal rates are correct.  Patient stated she understood and has all insulin pump supplies at bedside.  Spoke with Maureen Ralphsesar, RN caring for patient today.  Asked RN to please call Dr. Jacky KindleAronson with 2pm BMET results.  Also reminded RN to overlap IV insulin drip and insulin pump by one hour and then turn IV insulin drip off if Dr. Jacky KindleAronson gives the orders for patient to resume her pump this afternoon.  Will follow. Ambrose FinlandJeannine Johnston Madalene Mickler RN, MSN, CDE Diabetes Coordinator Inpatient Diabetes Program Team Pager: [phone] (8a-10p)

## 2013-06-09 LAB — BASIC METABOLIC PANEL
BUN: 14 mg/dL (ref 6–23)
CO2: 20 meq/L (ref 19–32)
CREATININE: 0.72 mg/dL (ref 0.50–1.10)
Calcium: 8.3 mg/dL — ABNORMAL LOW (ref 8.4–10.5)
Chloride: 112 mEq/L (ref 96–112)
GFR calc Af Amer: >90 mL/min (ref >90)
GFR calc non Af Amer: >90 mL/min (ref >90)
Glucose, Bld: 127 mg/dL — ABNORMAL HIGH (ref 70–99)
Potassium: 3.4 mEq/L — ABNORMAL LOW (ref 3.7–5.3)
SODIUM: 143 meq/L (ref 137–147)

## 2013-06-09 LAB — GLUCOSE, CAPILLARY
GLUCOSE-CAPILLARY: 152 mg/dL — AB (ref 70–99)
GLUCOSE-CAPILLARY: 99 mg/dL (ref 70–99)

## 2013-06-09 MED ORDER — ASPIRIN 325 MG PO TBEC: 325.0000 mg | DELAYED_RELEASE_TABLET | Freq: Every day | ORAL | Status: AC

## 2013-06-09 NOTE — Discharge Summary (Signed)
DISCHARGE SUMMARY  Cathy Martin  MR#: [id number]  DOB:1980/12/10  Date of Admission: 06/04/2013 Date of Discharge: 06/09/2013  Attending Physician:Rayfield Beem A  Patient's PCP:No primary provider on file.  Consults:Treatment Team:  Minda Meo, MD  Discharge Diagnoses: Active Problems:   DKA (diabetic ketoacidoses)   Acute respiratory failure   Leukocytosis   Elevated troponin I level   NSVT (nonsustained ventricular tachycardia)   Discharge Medications:   Medication List         aspirin 325 MG EC tablet  Take 1 tablet (325 mg total) by mouth daily.     insulin pump Soln  Inject 1 each into the skin continuous. Pt uses Novolog insulin in pump.     MAGNESIUM PO  Take 1 tablet by mouth daily.     VITAMIN D-3 PO  Take 1 tablet by mouth daily.        Hospital Procedures: Dg Chest Port 1 View  06/06/2013   CLINICAL DATA:  Respiratory failure  EXAM: PORTABLE CHEST - 1 VIEW  COMPARISON:  06/04/2013  FINDINGS: Endotracheal tube has been removed. Central line tip remains in the cavoatrial junction. No pneumothorax  Decreased lung volume with increase in bibasilar atelectasis.  Pulmonary vascular congestion and small pleural effusions.  IMPRESSION: Following extubation, there is increase in bibasilar atelectasis with decreased lung volume  Vascular congestion with small pleural effusions have developed since 06/04/2013   Electronically Signed   By: Marlan Palau M.D.   On: 06/06/2013 07:52   Dg Chest Port 1 View  06/04/2013   CLINICAL DATA:  Central line placement.  EXAM: PORTABLE CHEST - 1 VIEW  COMPARISON:  06/04/2013  FINDINGS: Left internal jugular central line is in place. The tip is at the cavoatrial junction. No pneumothorax.  Endotracheal tube is in stable position. Improving aeration of the lungs. No confluent opacities or effusions.  IMPRESSION: Left central line placement with the tip at the cavoatrial junction. No pneumothorax.  Improved aeration of  the lungs.   Electronically Signed   By: Charlett Nose M.D.   On: 06/04/2013 23:07   Dg Chest Portable 1 View  06/04/2013   CLINICAL DATA:  ET tube placement.  EXAM: PORTABLE CHEST - 1 VIEW  COMPARISON:  Chest 06/04/2013 at 1700 hr.  FINDINGS: Endotracheal tube is in place with the tip in good position at the level of the clavicular heads. Lungs are clear. Heart size is normal. No pneumothorax or pleural effusion.  IMPRESSION: ET tube in good position.  Lungs clear.   Electronically Signed   By: Drusilla Kanner M.D.   On: 06/04/2013 18:27   Dg Chest Port 1 View  06/04/2013   CLINICAL DATA:  Emesis.  EXAM: PORTABLE CHEST - 1 VIEW  COMPARISON:  November 26, 2005.  FINDINGS: The heart size and mediastinal contours are within normal limits. Both lungs are clear. The visualized skeletal structures are unremarkable.  IMPRESSION: No active disease.   Electronically Signed   By: Roque Lias M.D.   On: 06/04/2013 17:12    History of Present Illness:  Patient is a type I diabetic since childhood has an insulin pump. Patient was doing well until this morning when she had sudden onset of a vomiting illness vomited more than 20 times. In route by EMS patient was markedly tachycardic and started to have altered mental status. On arrival here she was sedated but would answer her age appropriately and follow commands. Initial impression was DKA. Blood gas was done early  on which showed pH is 6.9 lm venous gas. PCO2 was not significantly elevated. Patient's blood sugars were in the 150s. Lactic acid was done early on to that was markedly elevated. The lactic acid was 5.7. Patient was given 2 L of IV fluids started on nonglucose stabilizer. No real improvement with the IV fluids heart rate remained 150-160 a sinus tach. Patient blood pressure was never hypotensive. Patient's mental status started to worsen now with more agitation and confusion. Start as not to follow commands well. Patient also had blood cultures done urine  culture done and started on broad-spectrum antibiotics for wound was now presumed to be a septic picture along with the AA. Patient started on Zosyn and Vanco. Temp Foley being placed. Contacted pulmonary critical care informed them that we wouldn't intubate the patient due to the change in mental status and that the [facility] is going to need to transport to the ICU.  Intubation went smoothly no complications. Patient's blood pressure remained stable oxygen sats never fell below 95%. [facility] is in route. Patient started on propofol sedation drip. Patient also Propofol bolus.     Hospital Course: Patient was initially admitted to the critical care team with profound diabetic ketoacidosis, multiple metabolic arrangements and respiratory failure requiring intubation for airway stabilization. She was placed on IV fluids, IV insulin and supported with ventilatory support. Ultimately she was extubated and transition to by mouth dose and subcutaneous insulin and to the floor on my service. It should be noted that there was never any evidence of any underlying event all infectious disease workup was negative. She did have a lesion involving the posterior aspect of the left thigh which looks like traumatic shearing no evidence of infection. Upon my picking her up on the first day she was nauseated and it appeared had relapsed back into early DKA. We restarted the IV insulin infusion as well as crystalloid and ultimately transitioned her back to her insulin pump excellent success. At the time of discharge her blood sugars are running 70 to low 100s and serum CO2 has stabilized. She tolerated diet for 48 hours and has been ambulating is back to baseline. All cultures have remained negative. This are presumed to be a viral illness that precipitated the DKA. Patient herself is well informed and educated as is her family leading to excellent compliance. Cardiology was consulted at one point because of elevated troponin but  was not felt to be an underlying cardiac insult this is felt to be mainly metabolic. Echocardiogram was unremarkable.  Day of Discharge Exam BP 124/67  Pulse 71  Temp(Src) 98.1 F (36.7 C) (Oral)  Resp 18  Ht 5' 6.14" (1.68 m)  Wt 106.988 kg (235 lb 13.9 oz)  BMI 37.91 kg/m2  SpO2 99%  LMP 05/18/2013  Physical Exam: General appearance: alert, cooperative and no distress Eyes: no scleral icterus Throat: oropharynx moist without erythema Resp: clear to auscultation bilaterally Cardio: regular rate and rhythm, S1, S2 normal, no murmur, click, rub or gallop Extremities: no clubbing, cyanosis or edema Neurologically she is normal good strength normal higher cortical functioning. 2 x 3 cm excoriated lesion involving the posterior aspect of her left thigh looks like from shearing is clean and healing  Discharge Labs:  Recent Labs  06/08/13 1344 06/09/13 0356  NA 142 143  K 3.5* 3.4*  CL 110 112  CO2 19 20  GLUCOSE 168* 127*  BUN 14 14  CREATININE 0.77 0.72  CALCIUM 9.0 8.3*   No results  found for this basename: AST, ALT, ALKPHOS, BILITOT, PROT, ALBUMIN,  in the last 72 hours  Recent Labs  06/07/13 0618 06/08/13 0450  WBC 15.7* 6.7  HGB 12.7 11.1*  HCT 38.7 33.1*  MCV 87.0 85.1  PLT 248 202    Recent Labs  06/07/13 0618  TROPONINI 3.36*   No results found for this basename: TSH, T4TOTAL, FREET3, T3FREE, THYROIDAB,  in the last 72 hours No results found for this basename: VITAMINB12, FOLATE, FERRITIN, TIBC, IRON, RETICCTPCT,  in the last 72 hours  Discharge instructions:     Discharge Orders   Future Orders Complete By Expires   Diet - low sodium heart healthy  As directed    Increase activity slowly  As directed       Disposition: Home  Follow-up Appts: [facility]  Call for appointment.  Condition on Discharge: Stable and improved  Tests Needing Follow-up: None  Signed: Shaunee Mulkern  A 06/09/2013, 8:45 AM

## 2013-06-10 LAB — CULTURE, BLOOD (ROUTINE X 2): Culture: NO GROWTH

## 2014-02-19 ENCOUNTER — Encounter (HOSPITAL_COMMUNITY): Payer: Self-pay | Admitting: Anesthesiology

## 2014-10-15 ENCOUNTER — Other Ambulatory Visit: Payer: Self-pay

## 2014-10-30 ENCOUNTER — Encounter: Payer: Self-pay | Admitting: Genetic Counselor

## 2014-11-14 ENCOUNTER — Other Ambulatory Visit (HOSPITAL_COMMUNITY): Payer: Self-pay | Admitting: Endocrinology

## 2014-11-14 DIAGNOSIS — R946 Abnormal results of thyroid function studies: Secondary | ICD-10-CM

## 2014-11-29 ENCOUNTER — Encounter (HOSPITAL_COMMUNITY)
Admission: RE | Admit: 2014-11-29 | Discharge: 2014-11-29 | Disposition: A | Discharge status: Home / Self Care | Payer: 59 | Source: Ambulatory Visit | Attending: Endocrinology | Admitting: Endocrinology

## 2014-11-29 DIAGNOSIS — R946 Abnormal results of thyroid function studies: Secondary | ICD-10-CM | POA: Insufficient documentation

## 2014-11-30 ENCOUNTER — Encounter (HOSPITAL_COMMUNITY)
Admission: RE | Admit: 2014-11-30 | Discharge: 2014-11-30 | Disposition: A | Discharge status: Home / Self Care | Payer: 59 | Source: Ambulatory Visit | Attending: Endocrinology | Admitting: Endocrinology

## 2014-11-30 DIAGNOSIS — R946 Abnormal results of thyroid function studies: Secondary | ICD-10-CM | POA: Diagnosis not present

## 2014-11-30 MED ORDER — SODIUM PERTECHNETATE TC 99M INJECTION
10.1000 | Freq: Once | INTRAVENOUS | Status: AC | PRN
Start: 2014-11-30 — End: 2014-11-30
  Administered 2014-11-30: 10.1 via INTRAVENOUS

## 2014-11-30 MED ORDER — SODIUM IODIDE I 131 CAPSULE
12.8000 | Freq: Once | INTRAVENOUS | Status: AC | PRN
  Administered 2014-11-30: 12.8 via ORAL

## 2016-05-13 DIAGNOSIS — E109 Type 1 diabetes mellitus without complications: Secondary | ICD-10-CM | POA: Diagnosis not present

## 2016-05-13 DIAGNOSIS — Z794 Long term (current) use of insulin: Secondary | ICD-10-CM | POA: Diagnosis not present

## 2016-05-14 DIAGNOSIS — E079 Disorder of thyroid, unspecified: Secondary | ICD-10-CM | POA: Diagnosis not present

## 2016-05-14 DIAGNOSIS — E109 Type 1 diabetes mellitus without complications: Secondary | ICD-10-CM | POA: Diagnosis not present

## 2016-05-14 DIAGNOSIS — N920 Excessive and frequent menstruation with regular cycle: Secondary | ICD-10-CM | POA: Diagnosis not present

## 2016-05-27 DIAGNOSIS — Z01419 Encounter for gynecological examination (general) (routine) without abnormal findings: Secondary | ICD-10-CM | POA: Diagnosis not present

## 2016-05-28 DIAGNOSIS — Z01419 Encounter for gynecological examination (general) (routine) without abnormal findings: Secondary | ICD-10-CM | POA: Diagnosis not present

## 2016-05-29 DIAGNOSIS — Z719 Counseling, unspecified: Secondary | ICD-10-CM | POA: Diagnosis not present

## 2016-06-05 DIAGNOSIS — Z719 Counseling, unspecified: Secondary | ICD-10-CM | POA: Diagnosis not present

## 2016-06-12 DIAGNOSIS — Z719 Counseling, unspecified: Secondary | ICD-10-CM | POA: Diagnosis not present

## 2016-06-16 DIAGNOSIS — Z794 Long term (current) use of insulin: Secondary | ICD-10-CM | POA: Diagnosis not present

## 2016-06-16 DIAGNOSIS — E109 Type 1 diabetes mellitus without complications: Secondary | ICD-10-CM | POA: Diagnosis not present

## 2016-06-19 DIAGNOSIS — Z719 Counseling, unspecified: Secondary | ICD-10-CM | POA: Diagnosis not present

## 2016-06-26 DIAGNOSIS — Z719 Counseling, unspecified: Secondary | ICD-10-CM | POA: Diagnosis not present

## 2016-07-03 DIAGNOSIS — Z719 Counseling, unspecified: Secondary | ICD-10-CM | POA: Diagnosis not present

## 2016-07-06 DIAGNOSIS — E109 Type 1 diabetes mellitus without complications: Secondary | ICD-10-CM | POA: Diagnosis not present

## 2016-07-06 DIAGNOSIS — Z794 Long term (current) use of insulin: Secondary | ICD-10-CM | POA: Diagnosis not present

## 2016-07-10 DIAGNOSIS — Z719 Counseling, unspecified: Secondary | ICD-10-CM | POA: Diagnosis not present

## 2016-07-17 DIAGNOSIS — Z719 Counseling, unspecified: Secondary | ICD-10-CM | POA: Diagnosis not present

## 2016-07-24 DIAGNOSIS — Z719 Counseling, unspecified: Secondary | ICD-10-CM | POA: Diagnosis not present

## 2016-07-31 DIAGNOSIS — Z719 Counseling, unspecified: Secondary | ICD-10-CM | POA: Diagnosis not present

## 2016-08-07 DIAGNOSIS — Z794 Long term (current) use of insulin: Secondary | ICD-10-CM | POA: Diagnosis not present

## 2016-08-07 DIAGNOSIS — E109 Type 1 diabetes mellitus without complications: Secondary | ICD-10-CM | POA: Diagnosis not present

## 2016-08-07 DIAGNOSIS — Z719 Counseling, unspecified: Secondary | ICD-10-CM | POA: Diagnosis not present

## 2016-08-14 DIAGNOSIS — Z719 Counseling, unspecified: Secondary | ICD-10-CM | POA: Diagnosis not present

## 2016-08-21 DIAGNOSIS — Z719 Counseling, unspecified: Secondary | ICD-10-CM | POA: Diagnosis not present

## 2016-08-26 DIAGNOSIS — Z1231 Encounter for screening mammogram for malignant neoplasm of breast: Secondary | ICD-10-CM | POA: Diagnosis not present

## 2016-09-03 DIAGNOSIS — E079 Disorder of thyroid, unspecified: Secondary | ICD-10-CM | POA: Diagnosis not present

## 2016-09-03 DIAGNOSIS — E109 Type 1 diabetes mellitus without complications: Secondary | ICD-10-CM | POA: Diagnosis not present

## 2016-09-15 DIAGNOSIS — Z794 Long term (current) use of insulin: Secondary | ICD-10-CM | POA: Diagnosis not present

## 2016-09-15 DIAGNOSIS — E109 Type 1 diabetes mellitus without complications: Secondary | ICD-10-CM | POA: Diagnosis not present

## 2016-10-12 DIAGNOSIS — Z794 Long term (current) use of insulin: Secondary | ICD-10-CM | POA: Diagnosis not present

## 2016-10-12 DIAGNOSIS — E109 Type 1 diabetes mellitus without complications: Secondary | ICD-10-CM | POA: Diagnosis not present

## 2016-11-16 DIAGNOSIS — Z794 Long term (current) use of insulin: Secondary | ICD-10-CM | POA: Diagnosis not present

## 2016-11-16 DIAGNOSIS — E109 Type 1 diabetes mellitus without complications: Secondary | ICD-10-CM | POA: Diagnosis not present

## 2016-11-24 DIAGNOSIS — E079 Disorder of thyroid, unspecified: Secondary | ICD-10-CM | POA: Diagnosis not present

## 2016-12-29 DIAGNOSIS — E109 Type 1 diabetes mellitus without complications: Secondary | ICD-10-CM | POA: Diagnosis not present

## 2016-12-29 DIAGNOSIS — Z794 Long term (current) use of insulin: Secondary | ICD-10-CM | POA: Diagnosis not present

## 2017-01-04 DIAGNOSIS — E079 Disorder of thyroid, unspecified: Secondary | ICD-10-CM | POA: Diagnosis not present

## 2017-01-04 DIAGNOSIS — J069 Acute upper respiratory infection, unspecified: Secondary | ICD-10-CM | POA: Diagnosis not present

## 2017-01-04 DIAGNOSIS — E109 Type 1 diabetes mellitus without complications: Secondary | ICD-10-CM | POA: Diagnosis not present

## 2017-02-17 DIAGNOSIS — Z794 Long term (current) use of insulin: Secondary | ICD-10-CM | POA: Diagnosis not present

## 2017-02-17 DIAGNOSIS — E109 Type 1 diabetes mellitus without complications: Secondary | ICD-10-CM | POA: Diagnosis not present

## 2017-04-06 DIAGNOSIS — E109 Type 1 diabetes mellitus without complications: Secondary | ICD-10-CM | POA: Diagnosis not present

## 2017-04-06 DIAGNOSIS — Z794 Long term (current) use of insulin: Secondary | ICD-10-CM | POA: Diagnosis not present

## 2017-04-26 DIAGNOSIS — J00 Acute nasopharyngitis [common cold]: Secondary | ICD-10-CM | POA: Diagnosis not present

## 2017-04-26 DIAGNOSIS — E079 Disorder of thyroid, unspecified: Secondary | ICD-10-CM | POA: Diagnosis not present

## 2017-04-26 DIAGNOSIS — E109 Type 1 diabetes mellitus without complications: Secondary | ICD-10-CM | POA: Diagnosis not present

## 2017-05-10 DIAGNOSIS — Z794 Long term (current) use of insulin: Secondary | ICD-10-CM | POA: Diagnosis not present

## 2017-05-10 DIAGNOSIS — E109 Type 1 diabetes mellitus without complications: Secondary | ICD-10-CM | POA: Diagnosis not present

## 2017-06-29 DIAGNOSIS — Z794 Long term (current) use of insulin: Secondary | ICD-10-CM | POA: Diagnosis not present

## 2017-06-29 DIAGNOSIS — E109 Type 1 diabetes mellitus without complications: Secondary | ICD-10-CM | POA: Diagnosis not present

## 2017-08-24 DIAGNOSIS — Z794 Long term (current) use of insulin: Secondary | ICD-10-CM | POA: Diagnosis not present

## 2017-08-24 DIAGNOSIS — E109 Type 1 diabetes mellitus without complications: Secondary | ICD-10-CM | POA: Diagnosis not present

## 2017-09-02 DIAGNOSIS — Z1231 Encounter for screening mammogram for malignant neoplasm of breast: Secondary | ICD-10-CM | POA: Diagnosis not present

## 2017-09-02 DIAGNOSIS — Z01419 Encounter for gynecological examination (general) (routine) without abnormal findings: Secondary | ICD-10-CM | POA: Diagnosis not present

## 2017-09-09 DIAGNOSIS — E109 Type 1 diabetes mellitus without complications: Secondary | ICD-10-CM | POA: Diagnosis not present

## 2017-09-09 DIAGNOSIS — E079 Disorder of thyroid, unspecified: Secondary | ICD-10-CM | POA: Diagnosis not present

## 2017-09-27 DIAGNOSIS — R1031 Right lower quadrant pain: Secondary | ICD-10-CM | POA: Diagnosis not present

## 2017-10-07 DIAGNOSIS — R109 Unspecified abdominal pain: Secondary | ICD-10-CM | POA: Diagnosis not present

## 2017-10-07 DIAGNOSIS — E079 Disorder of thyroid, unspecified: Secondary | ICD-10-CM | POA: Diagnosis not present

## 2017-10-07 DIAGNOSIS — L509 Urticaria, unspecified: Secondary | ICD-10-CM | POA: Diagnosis not present

## 2017-10-08 DIAGNOSIS — Z794 Long term (current) use of insulin: Secondary | ICD-10-CM | POA: Diagnosis not present

## 2017-10-08 DIAGNOSIS — E109 Type 1 diabetes mellitus without complications: Secondary | ICD-10-CM | POA: Diagnosis not present

## 2017-11-10 DIAGNOSIS — N83202 Unspecified ovarian cyst, left side: Secondary | ICD-10-CM | POA: Diagnosis not present

## 2017-11-17 DIAGNOSIS — L501 Idiopathic urticaria: Secondary | ICD-10-CM | POA: Diagnosis not present

## 2017-11-17 DIAGNOSIS — J3089 Other allergic rhinitis: Secondary | ICD-10-CM | POA: Diagnosis not present

## 2017-11-17 DIAGNOSIS — J301 Allergic rhinitis due to pollen: Secondary | ICD-10-CM | POA: Diagnosis not present

## 2017-11-19 DIAGNOSIS — E109 Type 1 diabetes mellitus without complications: Secondary | ICD-10-CM | POA: Diagnosis not present

## 2017-11-19 DIAGNOSIS — Z794 Long term (current) use of insulin: Secondary | ICD-10-CM | POA: Diagnosis not present

## 2018-01-02 DIAGNOSIS — E109 Type 1 diabetes mellitus without complications: Secondary | ICD-10-CM | POA: Diagnosis not present

## 2018-01-02 DIAGNOSIS — Z794 Long term (current) use of insulin: Secondary | ICD-10-CM | POA: Diagnosis not present

## 2018-01-31 DIAGNOSIS — E109 Type 1 diabetes mellitus without complications: Secondary | ICD-10-CM | POA: Diagnosis not present

## 2018-01-31 DIAGNOSIS — Z794 Long term (current) use of insulin: Secondary | ICD-10-CM | POA: Diagnosis not present

## 2018-02-28 ENCOUNTER — Encounter (HOSPITAL_COMMUNITY): Payer: Self-pay

## 2018-02-28 ENCOUNTER — Emergency Department (HOSPITAL_COMMUNITY)
Admission: EM | Admit: 2018-02-28 | Discharge: 2018-02-28 | Disposition: A | Discharge status: Home / Self Care | Payer: 59 | Attending: Emergency Medicine | Admitting: Emergency Medicine

## 2018-02-28 ENCOUNTER — Emergency Department (HOSPITAL_COMMUNITY): Payer: 59

## 2018-02-28 DIAGNOSIS — Y93E1 Activity, personal bathing and showering: Secondary | ICD-10-CM | POA: Diagnosis not present

## 2018-02-28 DIAGNOSIS — R55 Syncope and collapse: Secondary | ICD-10-CM

## 2018-02-28 DIAGNOSIS — R42 Dizziness and giddiness: Secondary | ICD-10-CM | POA: Diagnosis not present

## 2018-02-28 DIAGNOSIS — Y92012 Bathroom of single-family (private) house as the place of occurrence of the external cause: Secondary | ICD-10-CM | POA: Diagnosis not present

## 2018-02-28 DIAGNOSIS — S0990XA Unspecified injury of head, initial encounter: Secondary | ICD-10-CM | POA: Diagnosis not present

## 2018-02-28 DIAGNOSIS — Y998 Other external cause status: Secondary | ICD-10-CM | POA: Insufficient documentation

## 2018-02-28 DIAGNOSIS — Z79899 Other long term (current) drug therapy: Secondary | ICD-10-CM | POA: Insufficient documentation

## 2018-02-28 DIAGNOSIS — W19XXXA Unspecified fall, initial encounter: Secondary | ICD-10-CM | POA: Insufficient documentation

## 2018-02-28 DIAGNOSIS — Z794 Long term (current) use of insulin: Secondary | ICD-10-CM | POA: Insufficient documentation

## 2018-02-28 DIAGNOSIS — I252 Old myocardial infarction: Secondary | ICD-10-CM | POA: Diagnosis not present

## 2018-02-28 DIAGNOSIS — R4781 Slurred speech: Secondary | ICD-10-CM | POA: Diagnosis not present

## 2018-02-28 DIAGNOSIS — E119 Type 2 diabetes mellitus without complications: Secondary | ICD-10-CM | POA: Diagnosis not present

## 2018-02-28 DIAGNOSIS — S0993XA Unspecified injury of face, initial encounter: Secondary | ICD-10-CM | POA: Diagnosis not present

## 2018-02-28 NOTE — ED Triage Notes (Addendum)
Patient arrived via POV. Patient was in the shower yesterday and got too hot patient fell into her bathroom wall. Patient LOC for 45 seconds. Patient woke up this morning with slurred speech and swollen side of face, swollen lips, & heavy left eye. Patient sore from left arm down to left leg. Patient still c/o of dizziness while standing.   Hx. DB type 1.  [id number] this morning per patient and corrected with insulin pump.

## 2018-02-28 NOTE — ED Provider Notes (Signed)
[facility]-EMERGENCY DEPT Provider Note   CSN: [id number] Arrival date & time: 02/28/18  4098     History   Chief Complaint Chief Complaint  Patient presents with  . Fall  . Dizziness  . Loss of Consciousness    HPI Cathy Martin is a 37 y.o. female.  She presents to the ED after a syncopal event yesterday.  She said she was in a hot shower when she got up in the bathroom she felt lightheaded and then had a syncopal event where she fell and struck her face on the wall and then her left arm and right leg on the ground.  Her husband found her on the ground and said she has lost conscious for about 45 seconds.  She is been fine since then other than complaining of some facial pain left arm pain left leg pain and some lightheadedness.  She also feels like she is got a little slurred speech.  The history is provided by the patient.  Loss of Consciousness   This is a new problem. The current episode started yesterday. The problem has been resolved. She lost consciousness for a period of less than one minute. The problem is associated with normal activity. Associated symptoms include dizziness, light-headedness and slurred speech. Pertinent negatives include abdominal pain, back pain, bowel incontinence, chest pain, fever, focal sensory loss, focal weakness, headaches, nausea, palpitations, seizures, vertigo, visual change and vomiting. She has tried sleep for the symptoms. The treatment provided moderate relief. Her past medical history is significant for DM.    Past Medical History:  Diagnosis Date  . Diabetes mellitus without complication [facility])     Patient Active Problem List   Diagnosis Date Noted  . Elevated troponin I level 06/06/2013  . NSTEMI, type II 06/06/2013  . NSVT (nonsustained ventricular tachycardia) (HCC) 06/06/2013  . Acute respiratory failure (HCC) 06/05/2013  . Leukocytosis 06/05/2013  . DKA (diabetic ketoacidoses) (HCC) 06/04/2013     Past Surgical History:  Procedure Laterality Date  . CESAREAN SECTION    . CESAREAN SECTION N/A 06/03/2012   Procedure: CESAREAN SECTION;  Surgeon: Genia Del, MD;  Location: [facility];  Service: Obstetrics;  Laterality: N/A;     OB History    Gravida  2   Para  2   Term      Preterm  1   AB      Living  1     SAB      TAB      Ectopic      Multiple      Live Births  1            Home Medications    Prior to Admission medications   Medication Sig Start Date End Date Taking? Authorizing Provider  aspirin EC 325 MG EC tablet Take 1 tablet (325 mg total) by mouth daily. 06/09/13   Geoffry Paradise, MD  Cholecalciferol (VITAMIN D-3 PO) Take 1 tablet by mouth daily.    [provider]  Insulin Human (INSULIN PUMP) 100 unit/ml SOLN Inject 1 each into the skin continuous. Pt uses Novolog insulin in pump.    [provider]  MAGNESIUM PO Take 1 tablet by mouth daily.    [provider]    Family History History reviewed. No pertinent family history.  Social History Social History   Tobacco Use  . Smoking status: Never Smoker  Substance Use Topics  . Alcohol use: No  . Drug use:  No     Allergies   Food; Mushroom extract complex; and Pork-derived products   Review of Systems Review of Systems  Constitutional: Negative for fever.  HENT: Negative for sore throat.   Eyes: Negative for visual disturbance.  Respiratory: Negative for shortness of breath.   Cardiovascular: Positive for syncope. Negative for chest pain and palpitations.  Gastrointestinal: Negative for abdominal pain, bowel incontinence, nausea and vomiting.  Genitourinary: Negative for dysuria.  Musculoskeletal: Negative for back pain and neck pain.  Skin: Negative for rash.  Neurological: Positive for dizziness, speech difficulty and light-headedness. Negative for vertigo, focal weakness, seizures and headaches.     Physical Exam Updated Vital  Signs BP 136/78 (BP Location: Right Arm)   Pulse 86   Temp 97.6 F (36.4 C) (Oral)   Resp 20   LMP 02/24/2018   SpO2 99%   Physical Exam  Constitutional: She is oriented to person, place, and time. She appears well-developed and well-nourished. No distress.  HENT:  Head: Normocephalic.  She has some tenderness and bruising over her left orbital superior ridge and left zygoma.  Extraocular movements intact pupils equal round reactive to light no step-offs on the face  Eyes: Pupils are equal, round, and reactive to light. Conjunctivae and EOM are normal.  Neck: Neck supple.  Cardiovascular: Normal rate, regular rhythm and normal heart sounds.  No murmur heard. Pulmonary/Chest: Effort normal and breath sounds normal. No respiratory distress.  Abdominal: Soft. There is no tenderness.  Musculoskeletal: She exhibits tenderness. She exhibits no edema or deformity.  She has some mild tenderness over her left arm and left leg but no point tenderness or deformities.  Full range of motion normal strength and sensation.  Neurological: She is alert and oriented to person, place, and time. No cranial nerve deficit or sensory deficit. Coordination normal.  Skin: Skin is warm and dry.  Psychiatric: She has a normal mood and affect.  Nursing note and vitals reviewed.    ED Treatments / Results  Labs (all labs ordered are listed, but only abnormal results are displayed) Labs Reviewed - No data to display  EKG EKG Interpretation  Date/Time:  Monday February 28 2018 10:36:10 EST Ventricular Rate:  59 PR Interval:    QRS Duration: 91 QT Interval:  420 QTC Calculation: 416 R Axis:   74 Text Interpretation:  Sinus rhythm Low voltage, extremity and precordial leads similar pattern to prior 2/15 Confirmed by Meridee Score [phone]) on 02/28/2018 10:47:07 AM Also confirmed by Meridee Score [phone]), editor [place], Tamera Punt ([id number])  on 02/28/2018 2:05:29 PM   Radiology Ct Head Wo  Contrast  Result Date: 02/28/2018 CLINICAL DATA:  Fall in shower. EXAM: CT HEAD WITHOUT CONTRAST CT MAXILLOFACIAL WITHOUT CONTRAST TECHNIQUE: Multidetector CT imaging of the head and maxillofacial structures were performed using the standard protocol without intravenous contrast. Multiplanar CT image reconstructions of the maxillofacial structures were also generated. COMPARISON:  None. FINDINGS: CT HEAD FINDINGS Brain: No acute intracranial abnormality. Specifically, no hemorrhage, hydrocephalus, mass lesion, acute infarction, or significant intracranial injury. Vascular: No hyperdense vessel or unexpected calcification. Skull: No acute calvarial abnormality. Other: None CT MAXILLOFACIAL FINDINGS Osseous: No fracture or mandibular dislocation. No destructive process. Orbits: Negative. No traumatic or inflammatory finding. Sinuses: Clear Soft tissues: Negative IMPRESSION: No intracranial abnormality. No evidence of facial or orbital fracture. Electronically Signed   By: Charlett Nose M.D.   On: 02/28/2018 10:13   Ct Maxillofacial Wo Cm  Result Date: 02/28/2018 CLINICAL DATA:  Fall in shower.  EXAM: CT HEAD WITHOUT CONTRAST CT MAXILLOFACIAL WITHOUT CONTRAST TECHNIQUE: Multidetector CT imaging of the head and maxillofacial structures were performed using the standard protocol without intravenous contrast. Multiplanar CT image reconstructions of the maxillofacial structures were also generated. COMPARISON:  None. FINDINGS: CT HEAD FINDINGS Brain: No acute intracranial abnormality. Specifically, no hemorrhage, hydrocephalus, mass lesion, acute infarction, or significant intracranial injury. Vascular: No hyperdense vessel or unexpected calcification. Skull: No acute calvarial abnormality. Other: None CT MAXILLOFACIAL FINDINGS Osseous: No fracture or mandibular dislocation. No destructive process. Orbits: Negative. No traumatic or inflammatory finding. Sinuses: Clear Soft tissues: Negative IMPRESSION: No  intracranial abnormality. No evidence of facial or orbital fracture. Electronically Signed   By: Charlett Nose M.D.   On: 02/28/2018 10:13    Procedures Procedures (including critical care time)  Medications Ordered in ED Medications - No data to display   Initial Impression / Assessment and Plan / ED Course  I have reviewed the triage vital signs and the nursing notes.  Pertinent labs & imaging results that were available during my care of the patient were reviewed by me and considered in my medical decision making (see chart for details).    Well appearing female after syncopal event last night. Some local signs of trauma to head. dont feel needs labs as well appearing and syncope well explained by transient hypotension in the hot shower.   Final Clinical Impressions(s) / ED Diagnoses   Final diagnoses:  Syncope and collapse  Traumatic injury of head, initial encounter    ED Discharge Orders    None       Terrilee Files, MD 03/01/18 [phone]

## 2018-02-28 NOTE — Discharge Instructions (Addendum)
You were evaluated in the emergency department for evaluation for a syncopal event in which she struck your head.  Your CAT scans of your head and your face did not show any obvious bleeding or broken bones.  She continue Tylenol and ibuprofen and use ice to the affected areas.  Please follow-up with your doctor for further evaluation.  Return if any concerns.

## 2018-02-28 NOTE — ED Notes (Signed)
ED Provider at bedside. 

## 2018-03-08 DIAGNOSIS — E11319 Type 2 diabetes mellitus with unspecified diabetic retinopathy without macular edema: Secondary | ICD-10-CM | POA: Diagnosis not present

## 2018-03-08 DIAGNOSIS — E109 Type 1 diabetes mellitus without complications: Secondary | ICD-10-CM | POA: Diagnosis not present

## 2018-03-08 DIAGNOSIS — E079 Disorder of thyroid, unspecified: Secondary | ICD-10-CM | POA: Diagnosis not present

## 2018-03-08 DIAGNOSIS — Z23 Encounter for immunization: Secondary | ICD-10-CM | POA: Diagnosis not present

## 2018-03-24 DIAGNOSIS — Z794 Long term (current) use of insulin: Secondary | ICD-10-CM | POA: Diagnosis not present

## 2018-03-24 DIAGNOSIS — E109 Type 1 diabetes mellitus without complications: Secondary | ICD-10-CM | POA: Diagnosis not present

## 2018-05-20 DIAGNOSIS — E109 Type 1 diabetes mellitus without complications: Secondary | ICD-10-CM | POA: Diagnosis not present

## 2018-05-20 DIAGNOSIS — Z794 Long term (current) use of insulin: Secondary | ICD-10-CM | POA: Diagnosis not present

## 2018-07-13 DIAGNOSIS — Z794 Long term (current) use of insulin: Secondary | ICD-10-CM | POA: Diagnosis not present

## 2018-07-13 DIAGNOSIS — E109 Type 1 diabetes mellitus without complications: Secondary | ICD-10-CM | POA: Diagnosis not present

## 2018-08-25 DIAGNOSIS — E109 Type 1 diabetes mellitus without complications: Secondary | ICD-10-CM | POA: Diagnosis not present

## 2018-08-25 DIAGNOSIS — Z794 Long term (current) use of insulin: Secondary | ICD-10-CM | POA: Diagnosis not present

## 2018-09-06 DIAGNOSIS — E109 Type 1 diabetes mellitus without complications: Secondary | ICD-10-CM | POA: Diagnosis not present

## 2018-09-06 DIAGNOSIS — E11319 Type 2 diabetes mellitus with unspecified diabetic retinopathy without macular edema: Secondary | ICD-10-CM | POA: Diagnosis not present

## 2019-03-20 IMAGING — CT CT HEAD W/O CM
3 of 6 series · 15 of 47 positions shown, 18 images · non-contrast
Comparison: None.

CLINICAL DATA: Fall in shower.

EXAM:
CT HEAD WITHOUT CONTRAST
CT MAXILLOFACIAL WITHOUT CONTRAST
TECHNIQUE: Multidetector CT imaging of the head and maxillofacial structures
were performed using the standard protocol without intravenous
contrast. Multiplanar CT image reconstructions of the maxillofacial
structures were also generated.

[Series 5: coronal soft tissue · coronal · 0.30mm/px · 3 of 72 slices shown]
[im 18/72  brain]
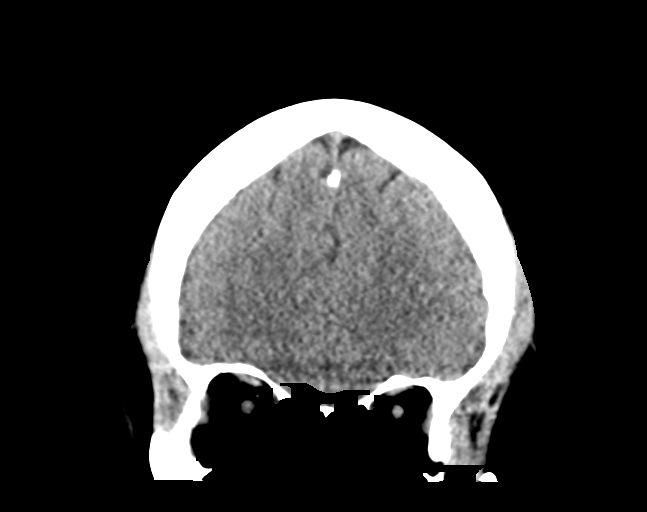
[im 36/72  brain]
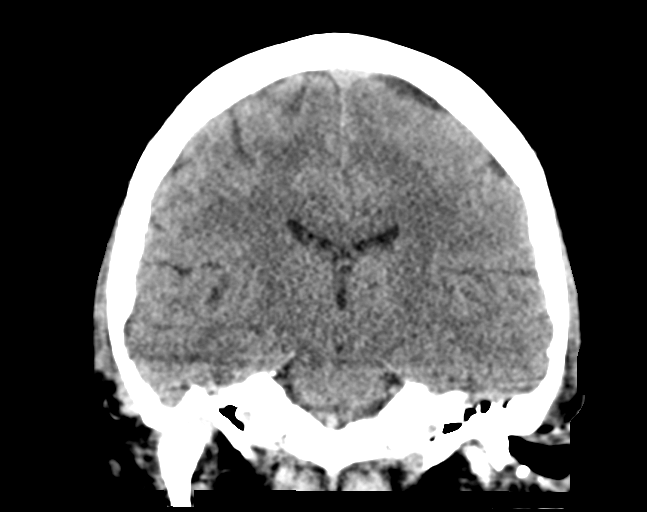
[im 54/72  brain]
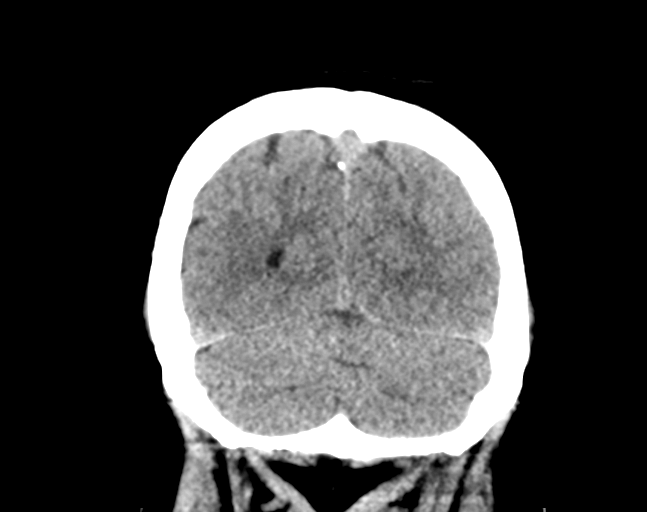

[Series 8: max soft · axial · 0.38mm/px · z∈[+1383,+1539]mm · 10 of 92 slices shown, 13 images]
[im 9/92  brain]
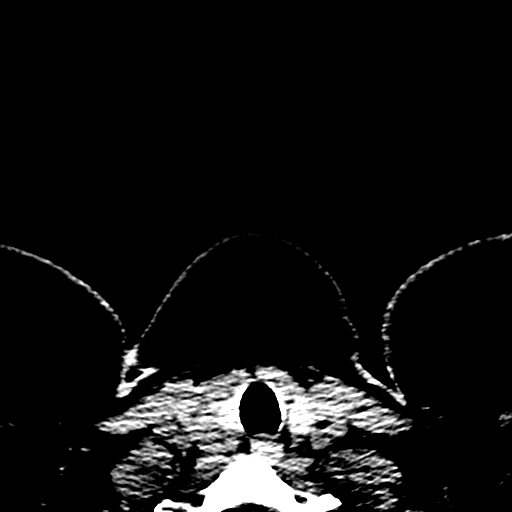
[im 9/92  bone]
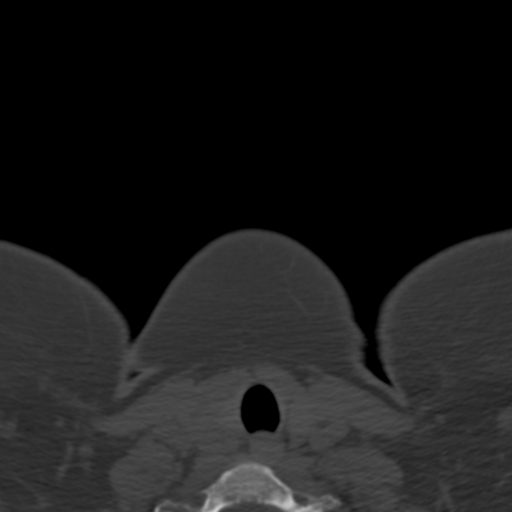
[im 18/92  brain]
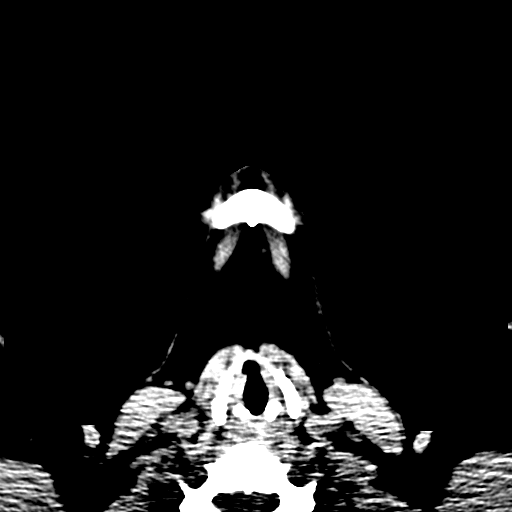
[im 27/92  brain]
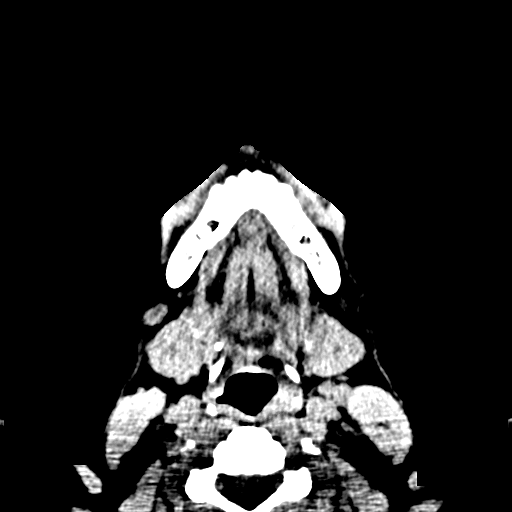
[im 35/92  brain]
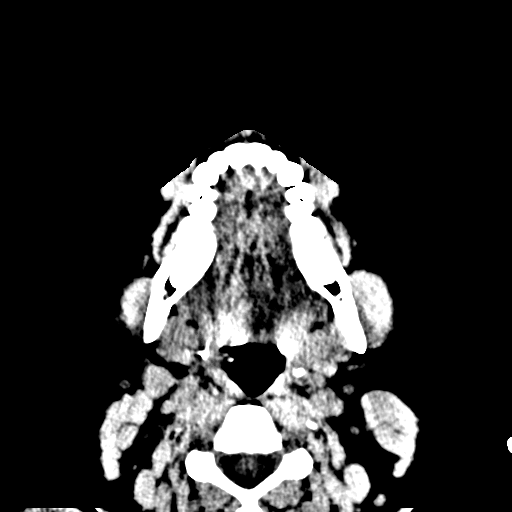
[im 44/92  brain]
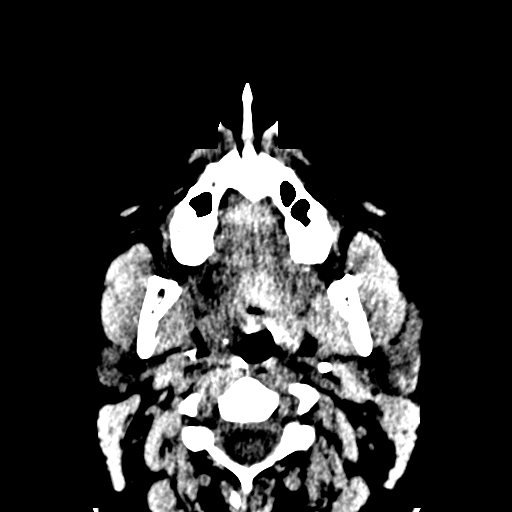
[im 44/92  bone]
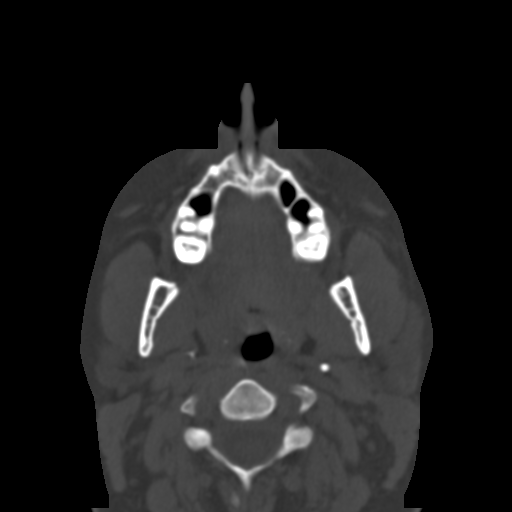
[im 53/92  brain]
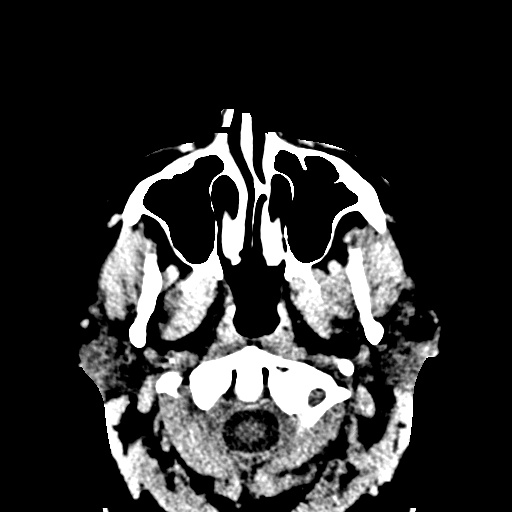
[im 61/92  brain]
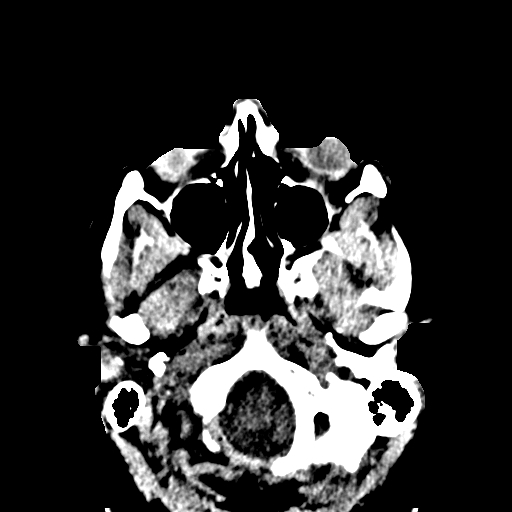
[im 70/92  brain]
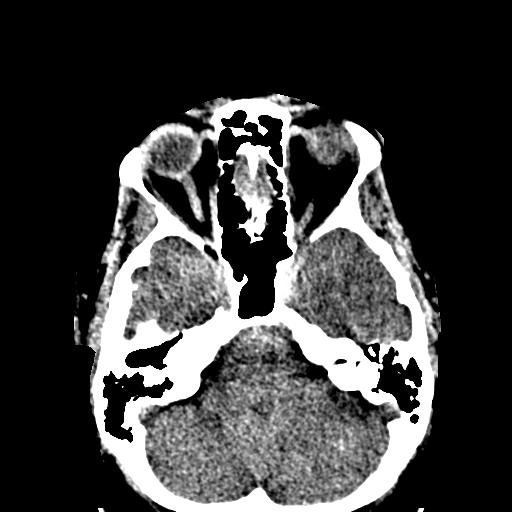
[im 79/92  brain]
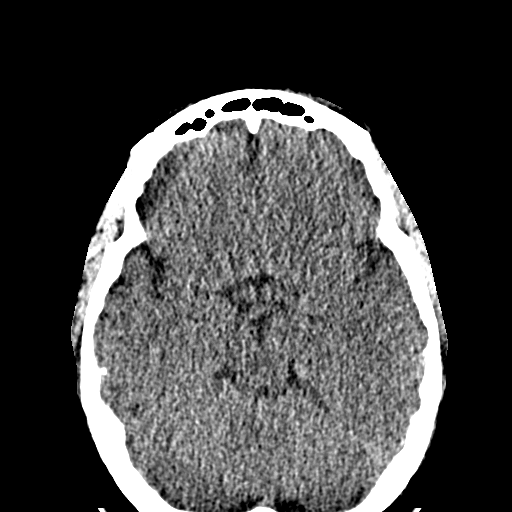
[im 79/92  bone]
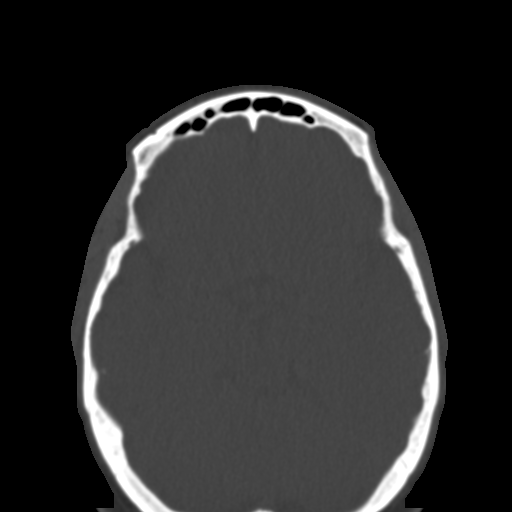
[im 87/92  brain]
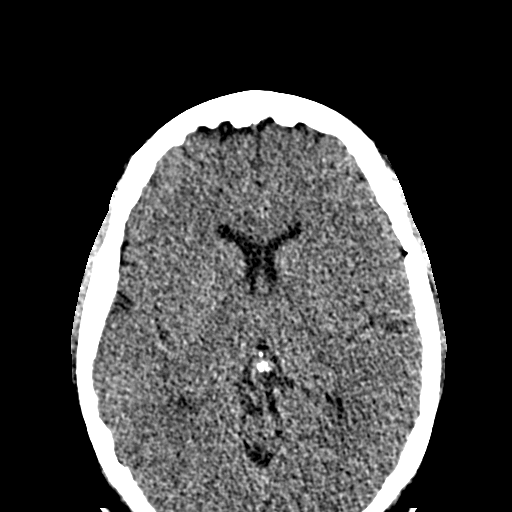

[Series 13: sagittal soft · sagittal · 0.34mm/px · 2 of 92 slices shown]
[im 31/92  brain]
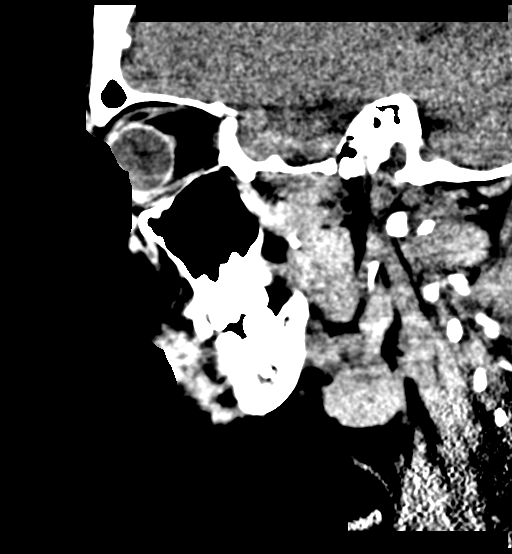
[im 61/92  brain]
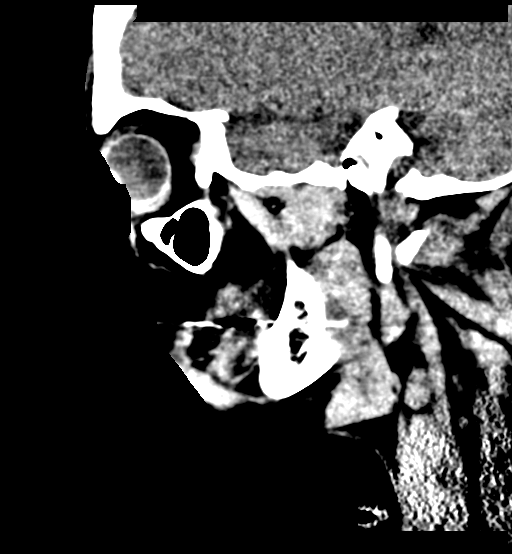

[15 of 47 positions shown; findings below may reference images not displayed]

FINDINGS: CT HEAD FINDINGS

Brain: No acute intracranial abnormality. Specifically, no
hemorrhage, hydrocephalus, mass lesion, acute infarction, or
significant intracranial injury.

Vascular: No hyperdense vessel or unexpected calcification.

Skull: No acute calvarial abnormality.

Other: None

CT MAXILLOFACIAL FINDINGS

Osseous: No fracture or mandibular dislocation. No destructive
process.

Orbits: Negative. No traumatic or inflammatory finding.

Sinuses: Clear

Soft tissues: Negative
IMPRESSION: No intracranial abnormality.

No evidence of facial or orbital fracture.

## 2019-12-07 ENCOUNTER — Encounter: Payer: Self-pay | Admitting: Genetic Counselor
